# Patient Record
Sex: Male | Born: 1950
Health system: Southern US, Community
[De-identification: ages and names within clinical notes are randomized; demographics above are authoritative.]

## PROBLEM LIST (undated history)

## (undated) DIAGNOSIS — J449 Chronic obstructive pulmonary disease, unspecified: Secondary | ICD-10-CM

## (undated) DIAGNOSIS — E119 Type 2 diabetes mellitus without complications: Secondary | ICD-10-CM

## (undated) DIAGNOSIS — M1990 Unspecified osteoarthritis, unspecified site: Secondary | ICD-10-CM

## (undated) DIAGNOSIS — E785 Hyperlipidemia, unspecified: Secondary | ICD-10-CM

## (undated) DIAGNOSIS — I10 Essential (primary) hypertension: Secondary | ICD-10-CM

## (undated) HISTORY — DX: Unspecified osteoarthritis, unspecified site: M19.90

## (undated) HISTORY — PX: LEG SURGERY: SHX1003

## (undated) HISTORY — PX: SHOULDER SURGERY: SHX246

## (undated) HISTORY — DX: Essential (primary) hypertension: I10

## (undated) HISTORY — DX: Hyperlipidemia, unspecified: E78.5

## (undated) HISTORY — DX: Chronic obstructive pulmonary disease, unspecified: J44.9

## (undated) HISTORY — PX: ANKLE SURGERY: SHX546

---

## 1999-11-20 ENCOUNTER — Ambulatory Visit (HOSPITAL_COMMUNITY): Admission: RE | Admit: 1999-11-20 | Discharge: 1999-11-20 | Payer: Self-pay | Admitting: Orthopedic Surgery

## 1999-11-20 ENCOUNTER — Encounter: Payer: Self-pay | Admitting: Neurology

## 2009-03-29 ENCOUNTER — Ambulatory Visit: Payer: Self-pay | Admitting: Unknown Physician Specialty

## 2009-05-13 ENCOUNTER — Ambulatory Visit: Payer: Self-pay | Admitting: Unknown Physician Specialty

## 2010-06-22 ENCOUNTER — Ambulatory Visit: Payer: Self-pay | Admitting: Surgery

## 2011-09-19 ENCOUNTER — Ambulatory Visit: Payer: Self-pay | Admitting: Internal Medicine

## 2014-03-14 DIAGNOSIS — E7800 Pure hypercholesterolemia, unspecified: Secondary | ICD-10-CM | POA: Insufficient documentation

## 2014-03-14 DIAGNOSIS — F1020 Alcohol dependence, uncomplicated: Secondary | ICD-10-CM | POA: Insufficient documentation

## 2014-03-14 DIAGNOSIS — M1990 Unspecified osteoarthritis, unspecified site: Secondary | ICD-10-CM | POA: Insufficient documentation

## 2014-03-14 DIAGNOSIS — J449 Chronic obstructive pulmonary disease, unspecified: Secondary | ICD-10-CM | POA: Insufficient documentation

## 2014-03-14 DIAGNOSIS — F3341 Major depressive disorder, recurrent, in partial remission: Secondary | ICD-10-CM | POA: Insufficient documentation

## 2014-03-14 DIAGNOSIS — J4489 Other specified chronic obstructive pulmonary disease: Secondary | ICD-10-CM | POA: Insufficient documentation

## 2014-03-14 DIAGNOSIS — K7010 Alcoholic hepatitis without ascites: Secondary | ICD-10-CM | POA: Insufficient documentation

## 2014-03-31 DIAGNOSIS — I739 Peripheral vascular disease, unspecified: Secondary | ICD-10-CM | POA: Insufficient documentation

## 2014-07-22 ENCOUNTER — Ambulatory Visit: Payer: Self-pay | Admitting: Vascular Surgery

## 2014-07-22 DIAGNOSIS — E119 Type 2 diabetes mellitus without complications: Secondary | ICD-10-CM | POA: Diagnosis not present

## 2014-07-22 DIAGNOSIS — I70211 Atherosclerosis of native arteries of extremities with intermittent claudication, right leg: Secondary | ICD-10-CM | POA: Diagnosis not present

## 2014-07-22 DIAGNOSIS — F17200 Nicotine dependence, unspecified, uncomplicated: Secondary | ICD-10-CM | POA: Diagnosis not present

## 2014-07-22 DIAGNOSIS — I739 Peripheral vascular disease, unspecified: Secondary | ICD-10-CM | POA: Diagnosis not present

## 2014-07-22 DIAGNOSIS — J449 Chronic obstructive pulmonary disease, unspecified: Secondary | ICD-10-CM | POA: Diagnosis not present

## 2014-07-22 DIAGNOSIS — F1020 Alcohol dependence, uncomplicated: Secondary | ICD-10-CM | POA: Diagnosis not present

## 2014-07-22 DIAGNOSIS — I10 Essential (primary) hypertension: Secondary | ICD-10-CM | POA: Diagnosis not present

## 2014-07-22 DIAGNOSIS — E780 Pure hypercholesterolemia: Secondary | ICD-10-CM | POA: Diagnosis not present

## 2014-07-22 LAB — BASIC METABOLIC PANEL
Anion Gap: 4 — ABNORMAL LOW (ref 7–16)
BUN: 8 mg/dL (ref 7–18)
Calcium, Total: 8.6 mg/dL (ref 8.5–10.1)
Chloride: 102 mmol/L (ref 98–107)
Co2: 32 mmol/L (ref 21–32)
Creatinine: 0.9 mg/dL (ref 0.60–1.30)
EGFR (African American): >60
EGFR (Non-African Amer.): >60
Glucose: 153 mg/dL — ABNORMAL HIGH (ref 65–99)
Osmolality: 277 (ref 275–301)
Potassium: 4.7 mmol/L (ref 3.5–5.1)
Sodium: 138 mmol/L (ref 136–145)

## 2014-07-29 DIAGNOSIS — E119 Type 2 diabetes mellitus without complications: Secondary | ICD-10-CM | POA: Diagnosis not present

## 2014-07-29 DIAGNOSIS — E780 Pure hypercholesterolemia: Secondary | ICD-10-CM | POA: Diagnosis not present

## 2014-08-03 DIAGNOSIS — I739 Peripheral vascular disease, unspecified: Secondary | ICD-10-CM | POA: Diagnosis not present

## 2014-08-03 DIAGNOSIS — I10 Essential (primary) hypertension: Secondary | ICD-10-CM | POA: Diagnosis not present

## 2014-08-03 DIAGNOSIS — E780 Pure hypercholesterolemia: Secondary | ICD-10-CM | POA: Diagnosis not present

## 2014-08-03 DIAGNOSIS — E119 Type 2 diabetes mellitus without complications: Secondary | ICD-10-CM | POA: Diagnosis not present

## 2014-09-03 DIAGNOSIS — I10 Essential (primary) hypertension: Secondary | ICD-10-CM | POA: Diagnosis not present

## 2014-09-03 DIAGNOSIS — E669 Obesity, unspecified: Secondary | ICD-10-CM | POA: Diagnosis not present

## 2014-09-03 DIAGNOSIS — I70213 Atherosclerosis of native arteries of extremities with intermittent claudication, bilateral legs: Secondary | ICD-10-CM | POA: Diagnosis not present

## 2014-09-03 DIAGNOSIS — F1020 Alcohol dependence, uncomplicated: Secondary | ICD-10-CM | POA: Diagnosis not present

## 2014-09-03 DIAGNOSIS — E785 Hyperlipidemia, unspecified: Secondary | ICD-10-CM | POA: Diagnosis not present

## 2014-09-03 DIAGNOSIS — I739 Peripheral vascular disease, unspecified: Secondary | ICD-10-CM | POA: Diagnosis not present

## 2014-09-03 DIAGNOSIS — F17200 Nicotine dependence, unspecified, uncomplicated: Secondary | ICD-10-CM | POA: Diagnosis not present

## 2014-11-07 NOTE — Op Note (Signed)
PATIENT NAME:  Jack Henson, Jack Henson MR#:  [id number] DATE OF BIRTH:  [birth year]  DATE OF PROCEDURE:  07/22/2014  PREOPERATIVE DIAGNOSES: 1. Peripheral arterial disease with claudication, bilateral lower extremities, right worse than left.  2. Alcohol and tobacco dependence.  3. Hypertension.   POSTOPERATIVE DIAGNOSES:  1. Peripheral arterial disease with claudication, bilateral lower extremities, right worse than left.  2. Alcohol and tobacco dependence.  3. Hypertension.   OPERATIVE PROCEDURES: 1. Ultrasound guidance for vascular access, left femoral artery.  2. Catheter placement into right posterior tibial and right peroneal arteries from left femoral approach.  3. Aortogram and selective right lower extremity angiogram.  4. Percutaneous transluminal angioplasty of right peroneal artery with 3 mm diameter angioplasty balloon.  5. Percutaneous transluminal angioplasty of right posterior tibial artery with 3 mm diameter angioplasty balloon.  6. Percutaneous transluminal angioplasty of right distal superficial femoral and above-knee popliteal artery with 6 mm diameter drug-coated Lutonix angioplasty balloon.  7. Self-expanding stent placement to the distal right SFA and above-knee popliteal artery with 7 mm diameter self-expanding stent for greater than 50% residual stenosis and dissection after angioplasty.  8. StarClose closure device, left femoral artery.   SURGEON: Algernon Huxley, MD.   ANESTHESIA: Local with moderate conscious sedation.   ESTIMATED BLOOD LOSS: 25 mL.   INDICATION FOR PROCEDURE: This is a gentleman who I saw in the office about a month ago. He had severe right lower extremity pain consistent with short distance claudication. He also had some claudication symptoms on the left. His ABI was mildly reduced on the left and markedly reduced on the right, and he is brought in for angiography for further evaluation and potential treatment. Risks and benefits were discussed. Informed  consent was obtained.   DESCRIPTION OF PROCEDURE: The patient is brought to the vascular suite. Groins were shaved and prepped and a sterile surgical field was created. The left femoral head was localized with fluoroscopy. The left femoral artery was visualized with ultrasound and found to be widely patent. It was then accessed under direct ultrasound guidance without difficulty with a Seldinger needle and a permanent image recorded. A 5 French sheath was placed. Pigtail catheter was placed in the aorta at the L1 level and AP aortogram was performed. This showed patent aorta and iliac segments without flow-limiting stenosis. The renal arteries are patent bilaterally without significant stenosis. I then crossed the aortic bifurcation with the catheter and the Advantage wire. I advanced the right femoral head and selective right lower extremity angiogram was then performed. This demonstrated an occlusion in the distal superficial femoral artery to reconstitute it in the above-knee popliteal artery. He then had tibial disease distally. His anterior tibial artery was diseased proximally and then chronically occluded in the mid to distal segments. The peroneal artery had diffuse areas of stenosis in its proximal and mid segments, but was continuous, and the posterior tibial artery had about an 80% stenosis about 3 cm beyond its origin and then was the best runoff distally. The patient was heparinized. A 6 French Ansell sheath was placed over a [employer] wire. I was able to cross the SFA and popliteal occlusion and confirm intraluminal flow in the popliteal artery without difficulty. I then exchanged for an 0.018 wire to address the tibial disease. With a Kumpe catheter, I advanced into the peroneal artery first with the 0.018 wire. A 3 mm diameter angioplasty balloon was inflated from the distal peroneal artery, up through the tibioperoneal trunk. Multiple areas of  narrowing were seen, which resolved with  angioplasty. The balloon was then deflated. The Kumpe catheter was placed in the tibioperoneal trunk and much better flow was seen through the peroneal artery. I then turned my attention to selectively cannulating the posterior tibial artery separately. The Kumpe catheter and the 0.018 wire were used to navigate in the posterior tibial artery and crossed the high-grade stenosis. A 3 mm diameter angioplasty balloon was then inflated from in the mid and proximal posterior tibial artery, up through the tibioperoneal trunk. The balloon was then removed. I then exchanged with a Kumpe catheter parked in the tibioperoneal trunk and imaging was performed, which showed excellent angiographic result in the posterior tibial artery and better flow distally, as well, in the peroneal artery. I then replaced the Advantage wire. The SFA and popliteal lesion were treated with a 6 mm diameter Lutonix drug-coated angioplasty balloon; however, after angioplasty, there was dissection, both at the proximal and initial occlusion and then the re-entry site in the above-knee popliteal artery. The proximal lesion also had a very high-grade stenosis of about 70% to 80%, so I elected to cover these areas with a self-expanding stent. A 7 mm diameter x 15 cm length self-expanding stent was selected, post dilated with a 6 mm balloon with an excellent angiographic completion result and no significant residual stenosis. I then pulled the sheath back to the ipsilateral external iliac artery and oblique arteriogram was performed. StarClose closure device was deployed in the usual fashion with excellent hemostatic result. The patient tolerated the procedure well and was taken to the recovery room in stable condition.    ____________________________ Algernon Huxley, MD jsd:mw D: 07/22/2014 10:02:36 ET T: 07/22/2014 14:33:16 ET JOB#: [id number]  cc: Algernon Huxley, MD, <Dictator> Algernon Huxley MD ELECTRONICALLY SIGNED 07/29/2014 10:36

## 2014-11-25 DIAGNOSIS — E119 Type 2 diabetes mellitus without complications: Secondary | ICD-10-CM | POA: Diagnosis not present

## 2014-11-25 DIAGNOSIS — I739 Peripheral vascular disease, unspecified: Secondary | ICD-10-CM | POA: Diagnosis not present

## 2014-12-02 DIAGNOSIS — F1010 Alcohol abuse, uncomplicated: Secondary | ICD-10-CM | POA: Diagnosis not present

## 2014-12-02 DIAGNOSIS — F3341 Major depressive disorder, recurrent, in partial remission: Secondary | ICD-10-CM | POA: Diagnosis not present

## 2014-12-02 DIAGNOSIS — J449 Chronic obstructive pulmonary disease, unspecified: Secondary | ICD-10-CM | POA: Diagnosis not present

## 2014-12-02 DIAGNOSIS — E780 Pure hypercholesterolemia: Secondary | ICD-10-CM | POA: Diagnosis not present

## 2014-12-03 DIAGNOSIS — E669 Obesity, unspecified: Secondary | ICD-10-CM | POA: Diagnosis not present

## 2014-12-03 DIAGNOSIS — I739 Peripheral vascular disease, unspecified: Secondary | ICD-10-CM | POA: Diagnosis not present

## 2014-12-03 DIAGNOSIS — F1020 Alcohol dependence, uncomplicated: Secondary | ICD-10-CM | POA: Diagnosis not present

## 2014-12-03 DIAGNOSIS — F17200 Nicotine dependence, unspecified, uncomplicated: Secondary | ICD-10-CM | POA: Diagnosis not present

## 2014-12-03 DIAGNOSIS — I10 Essential (primary) hypertension: Secondary | ICD-10-CM | POA: Diagnosis not present

## 2014-12-03 DIAGNOSIS — I70213 Atherosclerosis of native arteries of extremities with intermittent claudication, bilateral legs: Secondary | ICD-10-CM | POA: Diagnosis not present

## 2014-12-03 DIAGNOSIS — E785 Hyperlipidemia, unspecified: Secondary | ICD-10-CM | POA: Diagnosis not present

## 2014-12-28 DIAGNOSIS — E669 Obesity, unspecified: Secondary | ICD-10-CM | POA: Diagnosis not present

## 2014-12-28 DIAGNOSIS — I10 Essential (primary) hypertension: Secondary | ICD-10-CM | POA: Diagnosis not present

## 2014-12-28 DIAGNOSIS — E785 Hyperlipidemia, unspecified: Secondary | ICD-10-CM | POA: Diagnosis not present

## 2014-12-28 DIAGNOSIS — F17200 Nicotine dependence, unspecified, uncomplicated: Secondary | ICD-10-CM | POA: Diagnosis not present

## 2014-12-28 DIAGNOSIS — M7989 Other specified soft tissue disorders: Secondary | ICD-10-CM | POA: Diagnosis not present

## 2014-12-28 DIAGNOSIS — I70213 Atherosclerosis of native arteries of extremities with intermittent claudication, bilateral legs: Secondary | ICD-10-CM | POA: Diagnosis not present

## 2014-12-28 DIAGNOSIS — F1020 Alcohol dependence, uncomplicated: Secondary | ICD-10-CM | POA: Diagnosis not present

## 2014-12-28 DIAGNOSIS — I739 Peripheral vascular disease, unspecified: Secondary | ICD-10-CM | POA: Diagnosis not present

## 2015-04-04 DIAGNOSIS — H524 Presbyopia: Secondary | ICD-10-CM | POA: Diagnosis not present

## 2015-04-04 DIAGNOSIS — H5210 Myopia, unspecified eye: Secondary | ICD-10-CM | POA: Diagnosis not present

## 2015-06-01 DIAGNOSIS — F1010 Alcohol abuse, uncomplicated: Secondary | ICD-10-CM | POA: Diagnosis not present

## 2015-06-01 DIAGNOSIS — E7800 Pure hypercholesterolemia, unspecified: Secondary | ICD-10-CM | POA: Diagnosis not present

## 2015-06-01 DIAGNOSIS — I739 Peripheral vascular disease, unspecified: Secondary | ICD-10-CM | POA: Diagnosis not present

## 2015-06-01 DIAGNOSIS — E119 Type 2 diabetes mellitus without complications: Secondary | ICD-10-CM | POA: Diagnosis not present

## 2015-07-01 DIAGNOSIS — F1020 Alcohol dependence, uncomplicated: Secondary | ICD-10-CM | POA: Diagnosis not present

## 2015-07-01 DIAGNOSIS — F17200 Nicotine dependence, unspecified, uncomplicated: Secondary | ICD-10-CM | POA: Diagnosis not present

## 2015-07-01 DIAGNOSIS — E785 Hyperlipidemia, unspecified: Secondary | ICD-10-CM | POA: Diagnosis not present

## 2015-07-01 DIAGNOSIS — M7989 Other specified soft tissue disorders: Secondary | ICD-10-CM | POA: Diagnosis not present

## 2015-07-01 DIAGNOSIS — E669 Obesity, unspecified: Secondary | ICD-10-CM | POA: Diagnosis not present

## 2015-07-01 DIAGNOSIS — I70213 Atherosclerosis of native arteries of extremities with intermittent claudication, bilateral legs: Secondary | ICD-10-CM | POA: Diagnosis not present

## 2015-07-01 DIAGNOSIS — I10 Essential (primary) hypertension: Secondary | ICD-10-CM | POA: Diagnosis not present

## 2015-07-01 DIAGNOSIS — I739 Peripheral vascular disease, unspecified: Secondary | ICD-10-CM | POA: Diagnosis not present

## 2015-08-16 DIAGNOSIS — E7800 Pure hypercholesterolemia, unspecified: Secondary | ICD-10-CM | POA: Diagnosis not present

## 2015-08-16 DIAGNOSIS — I10 Essential (primary) hypertension: Secondary | ICD-10-CM | POA: Diagnosis not present

## 2015-08-16 DIAGNOSIS — F3341 Major depressive disorder, recurrent, in partial remission: Secondary | ICD-10-CM | POA: Diagnosis not present

## 2015-08-16 DIAGNOSIS — F1020 Alcohol dependence, uncomplicated: Secondary | ICD-10-CM | POA: Diagnosis not present

## 2015-08-16 DIAGNOSIS — Z0000 Encounter for general adult medical examination without abnormal findings: Secondary | ICD-10-CM | POA: Diagnosis not present

## 2015-08-16 DIAGNOSIS — K7010 Alcoholic hepatitis without ascites: Secondary | ICD-10-CM | POA: Diagnosis not present

## 2015-08-16 DIAGNOSIS — M25562 Pain in left knee: Secondary | ICD-10-CM | POA: Diagnosis not present

## 2015-08-16 DIAGNOSIS — J449 Chronic obstructive pulmonary disease, unspecified: Secondary | ICD-10-CM | POA: Diagnosis not present

## 2015-08-16 DIAGNOSIS — E119 Type 2 diabetes mellitus without complications: Secondary | ICD-10-CM | POA: Diagnosis not present

## 2015-08-16 DIAGNOSIS — M179 Osteoarthritis of knee, unspecified: Secondary | ICD-10-CM | POA: Diagnosis not present

## 2015-09-08 DIAGNOSIS — G8929 Other chronic pain: Secondary | ICD-10-CM | POA: Diagnosis not present

## 2015-09-08 DIAGNOSIS — M25562 Pain in left knee: Secondary | ICD-10-CM | POA: Diagnosis not present

## 2015-09-08 DIAGNOSIS — M1712 Unilateral primary osteoarthritis, left knee: Secondary | ICD-10-CM | POA: Diagnosis not present

## 2015-10-06 DIAGNOSIS — M1712 Unilateral primary osteoarthritis, left knee: Secondary | ICD-10-CM | POA: Diagnosis not present

## 2015-10-13 DIAGNOSIS — M1712 Unilateral primary osteoarthritis, left knee: Secondary | ICD-10-CM | POA: Diagnosis not present

## 2015-10-20 DIAGNOSIS — M1712 Unilateral primary osteoarthritis, left knee: Secondary | ICD-10-CM | POA: Diagnosis not present

## 2015-12-07 DIAGNOSIS — E7800 Pure hypercholesterolemia, unspecified: Secondary | ICD-10-CM | POA: Diagnosis not present

## 2015-12-07 DIAGNOSIS — E119 Type 2 diabetes mellitus without complications: Secondary | ICD-10-CM | POA: Diagnosis not present

## 2015-12-14 DIAGNOSIS — F3341 Major depressive disorder, recurrent, in partial remission: Secondary | ICD-10-CM | POA: Diagnosis not present

## 2015-12-14 DIAGNOSIS — Z0000 Encounter for general adult medical examination without abnormal findings: Secondary | ICD-10-CM | POA: Insufficient documentation

## 2015-12-14 DIAGNOSIS — Z23 Encounter for immunization: Secondary | ICD-10-CM | POA: Diagnosis not present

## 2015-12-14 DIAGNOSIS — E7800 Pure hypercholesterolemia, unspecified: Secondary | ICD-10-CM | POA: Diagnosis not present

## 2015-12-14 DIAGNOSIS — F1020 Alcohol dependence, uncomplicated: Secondary | ICD-10-CM | POA: Diagnosis not present

## 2015-12-14 DIAGNOSIS — J449 Chronic obstructive pulmonary disease, unspecified: Secondary | ICD-10-CM | POA: Diagnosis not present

## 2015-12-14 DIAGNOSIS — I739 Peripheral vascular disease, unspecified: Secondary | ICD-10-CM | POA: Diagnosis not present

## 2015-12-14 DIAGNOSIS — E119 Type 2 diabetes mellitus without complications: Secondary | ICD-10-CM | POA: Diagnosis not present

## 2016-01-03 DIAGNOSIS — I10 Essential (primary) hypertension: Secondary | ICD-10-CM | POA: Diagnosis not present

## 2016-01-03 DIAGNOSIS — E785 Hyperlipidemia, unspecified: Secondary | ICD-10-CM | POA: Diagnosis not present

## 2016-01-03 DIAGNOSIS — I70212 Atherosclerosis of native arteries of extremities with intermittent claudication, left leg: Secondary | ICD-10-CM | POA: Diagnosis not present

## 2016-01-03 DIAGNOSIS — F1020 Alcohol dependence, uncomplicated: Secondary | ICD-10-CM | POA: Diagnosis not present

## 2016-01-03 DIAGNOSIS — I739 Peripheral vascular disease, unspecified: Secondary | ICD-10-CM | POA: Diagnosis not present

## 2016-01-03 DIAGNOSIS — F17200 Nicotine dependence, unspecified, uncomplicated: Secondary | ICD-10-CM | POA: Diagnosis not present

## 2016-01-03 DIAGNOSIS — I70213 Atherosclerosis of native arteries of extremities with intermittent claudication, bilateral legs: Secondary | ICD-10-CM | POA: Diagnosis not present

## 2016-01-03 DIAGNOSIS — E669 Obesity, unspecified: Secondary | ICD-10-CM | POA: Diagnosis not present

## 2016-01-03 DIAGNOSIS — M7989 Other specified soft tissue disorders: Secondary | ICD-10-CM | POA: Diagnosis not present

## 2016-06-13 DIAGNOSIS — E119 Type 2 diabetes mellitus without complications: Secondary | ICD-10-CM | POA: Diagnosis not present

## 2016-06-13 DIAGNOSIS — E7800 Pure hypercholesterolemia, unspecified: Secondary | ICD-10-CM | POA: Diagnosis not present

## 2016-06-20 DIAGNOSIS — F1020 Alcohol dependence, uncomplicated: Secondary | ICD-10-CM | POA: Diagnosis not present

## 2016-06-20 DIAGNOSIS — E119 Type 2 diabetes mellitus without complications: Secondary | ICD-10-CM | POA: Diagnosis not present

## 2016-06-20 DIAGNOSIS — I10 Essential (primary) hypertension: Secondary | ICD-10-CM | POA: Diagnosis not present

## 2016-06-20 DIAGNOSIS — Z0000 Encounter for general adult medical examination without abnormal findings: Secondary | ICD-10-CM | POA: Diagnosis not present

## 2016-06-20 DIAGNOSIS — K7010 Alcoholic hepatitis without ascites: Secondary | ICD-10-CM | POA: Diagnosis not present

## 2016-06-20 DIAGNOSIS — J449 Chronic obstructive pulmonary disease, unspecified: Secondary | ICD-10-CM | POA: Diagnosis not present

## 2016-06-20 DIAGNOSIS — F3341 Major depressive disorder, recurrent, in partial remission: Secondary | ICD-10-CM | POA: Diagnosis not present

## 2016-06-20 DIAGNOSIS — I739 Peripheral vascular disease, unspecified: Secondary | ICD-10-CM | POA: Diagnosis not present

## 2016-06-20 DIAGNOSIS — E7800 Pure hypercholesterolemia, unspecified: Secondary | ICD-10-CM | POA: Diagnosis not present

## 2016-07-06 ENCOUNTER — Other Ambulatory Visit (INDEPENDENT_AMBULATORY_CARE_PROVIDER_SITE_OTHER): Payer: Self-pay | Admitting: Vascular Surgery

## 2016-07-06 DIAGNOSIS — I739 Peripheral vascular disease, unspecified: Secondary | ICD-10-CM

## 2016-07-10 ENCOUNTER — Ambulatory Visit (INDEPENDENT_AMBULATORY_CARE_PROVIDER_SITE_OTHER): Payer: Commercial Managed Care - HMO | Admitting: Vascular Surgery

## 2016-07-10 ENCOUNTER — Ambulatory Visit (INDEPENDENT_AMBULATORY_CARE_PROVIDER_SITE_OTHER): Payer: Commercial Managed Care - HMO

## 2016-07-10 ENCOUNTER — Encounter (INDEPENDENT_AMBULATORY_CARE_PROVIDER_SITE_OTHER): Payer: Self-pay | Admitting: Vascular Surgery

## 2016-07-10 VITALS — BP 141/88 | HR 82 | Resp 17 | Ht 66.0 in | Wt 181.0 lb

## 2016-07-10 DIAGNOSIS — I10 Essential (primary) hypertension: Secondary | ICD-10-CM | POA: Diagnosis not present

## 2016-07-10 DIAGNOSIS — I739 Peripheral vascular disease, unspecified: Secondary | ICD-10-CM

## 2016-07-10 DIAGNOSIS — I70219 Atherosclerosis of native arteries of extremities with intermittent claudication, unspecified extremity: Secondary | ICD-10-CM | POA: Diagnosis not present

## 2016-07-10 DIAGNOSIS — E119 Type 2 diabetes mellitus without complications: Secondary | ICD-10-CM | POA: Insufficient documentation

## 2016-07-10 NOTE — Progress Notes (Signed)
MRN : [id number]  Jack Henson is a 66 y.o. ([birth year]) male who presents with chief complaint of  Chief Complaint  Patient presents with  . Follow-up  .  History of Present Illness: Patient returns today in follow up of PAD. He still has some pain in his legs with walking but is not currently lifestyle limiting. He overall seems to be doing pretty well. He does not have ulceration or infection. ABIs today are stable at 0.97 on the right and 1.06 on the left with pretty good wave forms. He does have some areas of elevated velocities in the right popliteal artery just below the previously placed stent as well as in the tibioperoneal trunk and in the left SFA/popliteal artery. None of these appear particularly high-grade  Current Outpatient Prescriptions  Medication Sig Dispense Refill  . amLODipine (NORVASC) 10 MG tablet Take by mouth.    . losartan (COZAAR) 100 MG tablet TAKE 1 TABLET ONE TIME DAILY    . omeprazole (PRILOSEC) 20 MG capsule Take by mouth.    . sertraline (ZOLOFT) 50 MG tablet Take by mouth.     No current facility-administered medications for this visit.     Past Medical History:  Diagnosis Date  . Arthritis   . COPD (chronic obstructive pulmonary disease) ([place])   . Hyperlipidemia   . Hypertension     Past Surgical History:  Procedure Laterality Date  . ANKLE SURGERY Right   . LEG SURGERY     stent placement  . SHOULDER SURGERY Right     Social History Social History  Substance Use Topics  . Smoking status: Former Research scientist (life sciences)  . Smokeless tobacco: Never Used  . Alcohol use Yes     Family History Family History  Problem Relation Age of Onset  . Kidney disease Mother   . Diabetes Father      No Known Allergies   REVIEW OF SYSTEMS (Negative unless checked)  Constitutional: [] Weight loss  [] Fever  [] Chills Cardiac: [] Chest pain   [] Chest pressure   [] Palpitations   [] Shortness of breath when laying flat   [] Shortness of breath at rest    [] Shortness of breath with exertion. Vascular:  [x] Pain in legs with walking   [] Pain in legs at rest   [] Pain in legs when laying flat   [x] Claudication   [] Pain in feet when walking  [] Pain in feet at rest  [] Pain in feet when laying flat   [] History of DVT   [] Phlebitis   [] Swelling in legs   [] Varicose veins   [] Non-healing ulcers Pulmonary:   [] Uses home oxygen   [] Productive cough   [] Hemoptysis   [] Wheeze  [] COPD   [] Asthma Neurologic:  [] Dizziness  [] Blackouts   [] Seizures   [] History of stroke   [] History of TIA  [] Aphasia   [] Temporary blindness   [] Dysphagia   [] Weakness or numbness in arms   [] Weakness or numbness in legs Musculoskeletal:  [x] Arthritis   [] Joint swelling   [x] Joint pain   [] Low back pain Hematologic:  [] Easy bruising  [] Easy bleeding   [] Hypercoagulable state   [] Anemic   Gastrointestinal:  [] Blood in stool   [] Vomiting blood  [] Gastroesophageal reflux/heartburn   [] Abdominal pain Genitourinary:  [] Chronic kidney disease   [] Difficult urination  [] Frequent urination  [] Burning with urination   [] Hematuria Skin:  [] Rashes   [] Ulcers   [] Wounds Psychological:  [] History of anxiety   []  History of major depression.  Physical Examination  BP (!) 141/88  Pulse 82   Resp 17   Ht 5\' 6"  (1.676 m)   Wt 181 lb (82.1 kg)   BMI 29.21 kg/m  Gen:  WD/WN, NAD. Appears older than stated age Head: [place]/AT, No temporalis wasting. Ear/Nose/Throat: Hearing grossly intact, nares w/o erythema or drainage, trachea midline Eyes: Conjunctiva clear. Sclera non-icteric Neck: Supple.  No JVD.  Pulmonary:  Good air movement, no use of accessory muscles.  Cardiac: RRR, normal S1, S2 Vascular:  Vessel Right Left  Radial Palpable Palpable  Ulnar Palpable Palpable  Brachial Palpable Palpable  Carotid Palpable, without bruit Palpable, without bruit  Aorta Not palpable N/A  Femoral Palpable Palpable  Popliteal Palpable Palpable  PT Trace Palpable Palpable  DP Palpable Palpable    Gastrointestinal: soft, non-tender/non-distended. No guarding/reflex.  Musculoskeletal: M/S 5/5 throughout.  Walks with a cane Neurologic: Sensation grossly intact in extremities.  Symmetrical.  Speech is fluent.  Psychiatric: Judgment intact, Mood & affect appropriate for pt's clinical situation. Dermatologic: No rashes or ulcers noted.  No cellulitis or open wounds. Lymph : No Cervical, Axillary, or Inguinal lymphadenopathy.      Labs No results found for this or any previous visit (from the past 2160 hour(s)).  Radiology No results found.    Assessment/Plan  Diabetes ([place]) Now off of metformin as sugars have been running good. Glucose control important to reduce progression of atherosclerosis.  Essential hypertension, benign blood pressure control important in reducing the progression of atherosclerotic disease. On appropriate oral medications.   Atherosclerosis with limb claudication ([place]) Symptoms are currently stable and not lifestyle limiting. ABIs today are stable at 0.97 on the right and 1.06 on the left with pretty good wave forms. He does have some areas of elevated velocities in the right popliteal artery just below the previously placed stent as well as in the tibioperoneal trunk and in the left SFA/popliteal artery. None of these appear particularly high-grade, but all need to be monitored. He will continue his aspirin therapy. He will return in 6 months with noninvasive studies.    Leotis Pain, MD  07/10/2016 12:44 PM    This note was created with Dragon medical transcription system.  Any errors from dictation are purely unintentional

## 2016-07-10 NOTE — Assessment & Plan Note (Signed)
Now off of metformin as sugars have been running good. Glucose control important to reduce progression of atherosclerosis.

## 2016-07-10 NOTE — Assessment & Plan Note (Signed)
blood pressure control important in reducing the progression of atherosclerotic disease. On appropriate oral medications.  

## 2016-07-10 NOTE — Assessment & Plan Note (Signed)
Symptoms are currently stable and not lifestyle limiting. ABIs today are stable at 0.97 on the right and 1.06 on the left with pretty good wave forms. He does have some areas of elevated velocities in the right popliteal artery just below the previously placed stent as well as in the tibioperoneal trunk and in the left SFA/popliteal artery. None of these appear particularly high-grade, but all need to be monitored. He will continue his aspirin therapy. He will return in 6 months with noninvasive studies.

## 2016-12-26 ENCOUNTER — Ambulatory Visit (INDEPENDENT_AMBULATORY_CARE_PROVIDER_SITE_OTHER): Payer: Self-pay | Admitting: Vascular Surgery

## 2016-12-26 ENCOUNTER — Encounter (INDEPENDENT_AMBULATORY_CARE_PROVIDER_SITE_OTHER): Payer: Self-pay

## 2017-01-01 DIAGNOSIS — E119 Type 2 diabetes mellitus without complications: Secondary | ICD-10-CM | POA: Diagnosis not present

## 2017-01-01 DIAGNOSIS — F3341 Major depressive disorder, recurrent, in partial remission: Secondary | ICD-10-CM | POA: Diagnosis not present

## 2017-01-01 DIAGNOSIS — I10 Essential (primary) hypertension: Secondary | ICD-10-CM | POA: Diagnosis not present

## 2017-01-01 DIAGNOSIS — E7800 Pure hypercholesterolemia, unspecified: Secondary | ICD-10-CM | POA: Diagnosis not present

## 2017-01-08 DIAGNOSIS — J449 Chronic obstructive pulmonary disease, unspecified: Secondary | ICD-10-CM | POA: Diagnosis not present

## 2017-01-08 DIAGNOSIS — E7800 Pure hypercholesterolemia, unspecified: Secondary | ICD-10-CM | POA: Diagnosis not present

## 2017-01-08 DIAGNOSIS — F3341 Major depressive disorder, recurrent, in partial remission: Secondary | ICD-10-CM | POA: Diagnosis not present

## 2017-01-08 DIAGNOSIS — F1020 Alcohol dependence, uncomplicated: Secondary | ICD-10-CM | POA: Diagnosis not present

## 2017-01-08 DIAGNOSIS — Z0000 Encounter for general adult medical examination without abnormal findings: Secondary | ICD-10-CM | POA: Diagnosis not present

## 2017-01-08 DIAGNOSIS — I10 Essential (primary) hypertension: Secondary | ICD-10-CM | POA: Diagnosis not present

## 2017-01-08 DIAGNOSIS — I739 Peripheral vascular disease, unspecified: Secondary | ICD-10-CM | POA: Diagnosis not present

## 2017-01-08 DIAGNOSIS — E119 Type 2 diabetes mellitus without complications: Secondary | ICD-10-CM | POA: Diagnosis not present

## 2017-02-05 ENCOUNTER — Ambulatory Visit (INDEPENDENT_AMBULATORY_CARE_PROVIDER_SITE_OTHER): Payer: Medicare HMO

## 2017-02-05 ENCOUNTER — Ambulatory Visit (INDEPENDENT_AMBULATORY_CARE_PROVIDER_SITE_OTHER): Payer: Medicare HMO | Admitting: Vascular Surgery

## 2017-02-05 VITALS — HR 72 | Resp 17 | Ht 66.0 in | Wt 180.0 lb

## 2017-02-05 DIAGNOSIS — I70219 Atherosclerosis of native arteries of extremities with intermittent claudication, unspecified extremity: Secondary | ICD-10-CM

## 2017-02-05 DIAGNOSIS — E119 Type 2 diabetes mellitus without complications: Secondary | ICD-10-CM | POA: Diagnosis not present

## 2017-02-05 DIAGNOSIS — I10 Essential (primary) hypertension: Secondary | ICD-10-CM | POA: Diagnosis not present

## 2017-02-05 NOTE — Assessment & Plan Note (Signed)
His noninvasive studies today show moderately elevated velocities in the right SFA stent flow was preserved with a right ABI of 1.02 and a left ABI of 0.98. At this point, we can follow him on an annual basis with noninvasive studies. He will contact our office for any problems in the interim.

## 2017-02-05 NOTE — Progress Notes (Signed)
MRN : [id number]  Jack Henson is a 66 y.o. (February 17, 1951) male who presents with chief complaint of  Chief Complaint  Patient presents with  . Routine Post Op    6 month follow up u/s  .  History of Present Illness: Patient returns today in follow up of PAD. He is about 2-1/2 years status post intervention for severe right lower extremity ischemia. Quit smoking after that he currently is doing well without any major symptoms in his legs. He denies ulceration, infection, or ischemic rest pain. He really does not have much claudication symptoms currently. His noninvasive studies today show moderately elevated velocities in the right SFA stent flow was preserved with a right ABI of 1.02 and a left ABI of 0.98.  Current Outpatient Prescriptions  Medication Sig Dispense Refill  . amLODipine (NORVASC) 10 MG tablet Take by mouth.    . losartan (COZAAR) 100 MG tablet TAKE 1 TABLET ONE TIME DAILY    . omeprazole (PRILOSEC) 20 MG capsule Take by mouth.    . sertraline (ZOLOFT) 50 MG tablet Take by mouth.     No current facility-administered medications for this visit.         Past Medical History:  Diagnosis Date  . Arthritis   . COPD (chronic obstructive pulmonary disease) ([place])   . Hyperlipidemia   . Hypertension          Past Surgical History:  Procedure Laterality Date  . ANKLE SURGERY Right   . LEG SURGERY     stent placement  . SHOULDER SURGERY Right     Social History     Social History  Substance Use Topics  . Smoking status: Former Research scientist (life sciences)  . Smokeless tobacco: Never Used  . Alcohol use Yes     Family History      Family History  Problem Relation Age of Onset  . Kidney disease Mother   . Diabetes Father      No Known Allergies   REVIEW OF SYSTEMS (Negative unless checked)  Constitutional: [] Weight loss  [] Fever  [] Chills Cardiac: [] Chest pain   [] Chest pressure   [] Palpitations   [] Shortness of breath when laying  flat   [] Shortness of breath at rest   [] Shortness of breath with exertion. Vascular:  [x] Pain in legs with walking   [] Pain in legs at rest   [] Pain in legs when laying flat   [x] Claudication   [] Pain in feet when walking  [] Pain in feet at rest  [] Pain in feet when laying flat   [] History of DVT   [] Phlebitis   [] Swelling in legs   [] Varicose veins   [] Non-healing ulcers Pulmonary:   [] Uses home oxygen   [] Productive cough   [] Hemoptysis   [] Wheeze  [] COPD   [] Asthma Neurologic:  [] Dizziness  [] Blackouts   [] Seizures   [] History of stroke   [] History of TIA  [] Aphasia   [] Temporary blindness   [] Dysphagia   [] Weakness or numbness in arms   [] Weakness or numbness in legs Musculoskeletal:  [x] Arthritis   [] Joint swelling   [x] Joint pain   [] Low back pain Hematologic:  [] Easy bruising  [] Easy bleeding   [] Hypercoagulable state   [] Anemic   Gastrointestinal:  [] Blood in stool   [] Vomiting blood  [] Gastroesophageal reflux/heartburn   [] Abdominal pain Genitourinary:  [] Chronic kidney disease   [] Difficult urination  [] Frequent urination  [] Burning with urination   [] Hematuria Skin:  [] Rashes   [] Ulcers   [] Wounds Psychological:  [] History of anxiety   []   History of major depression.   Physical Examination  Pulse 72   Resp 17   Ht 5\' 6"  (1.676 m)   Wt 180 lb (81.6 kg)   BMI 29.05 kg/m  Gen:  WD/WN, NAD Head: [place]/AT, No temporalis wasting. Ear/Nose/Throat: Hearing grossly intact, nares w/o erythema or drainage, trachea midline Eyes: Conjunctiva clear. Sclera non-icteric Neck: Supple.  No JVD.  Pulmonary:  Good air movement, no use of accessory muscles.  Cardiac: RRR, normal S1, S2 Vascular:  Vessel Right Left  Radial Palpable Palpable                          PT Palpable Palpable  DP Palpable Palpable    Musculoskeletal: M/S 5/5 throughout.  No deformity or atrophy.  Neurologic: Sensation grossly intact in extremities.  Symmetrical.  Speech is fluent.  walks with a cane    Psychiatric: Judgment intact, Mood & affect appropriate for pt's clinical situation. Dermatologic: No rashes or ulcers noted.  No cellulitis or open wounds.       Labs Recent Results (from the past 2160 hour(s))  VAS [place] LOWER EXTREMITY ARTERIAL DUPLEX     Status: None (In process)   Collection Time: 02/05/17 11:30 AM  Result Value Ref Range   Right super femoral prox sys PSV 212 cm/s   Right super femoral mid sys PSV -83 cm/s   Right popliteal dist sys PSV -77 cm/s   Right peroneal sys PSV 33 cm/s   RIGHT ANT DIST TIBAL SYS PSV -19 cm/s   RIGHT POST TIB DIST SYS 49 cm/s    Radiology No results found.    Assessment/Plan Diabetes ([place]) Now off of metformin as sugars have been running good. Glucose control important to reduce progression of atherosclerosis.  Essential hypertension, benign blood pressure control important in reducing the progression of atherosclerotic disease. On appropriate oral medications  Atherosclerosis with limb claudication ([place]) His noninvasive studies today show moderately elevated velocities in the right SFA stent flow was preserved with a right ABI of 1.02 and a left ABI of 0.98. At this point, we can follow him on an annual basis with noninvasive studies. He will contact our office for any problems in the interim.    Leotis Pain, MD  02/05/2017 1:45 PM    This note was created with Dragon medical transcription system.  Any errors from dictation are purely unintentional

## 2017-02-05 NOTE — Patient Instructions (Signed)

## 2017-02-19 DIAGNOSIS — H524 Presbyopia: Secondary | ICD-10-CM | POA: Diagnosis not present

## 2017-02-21 DIAGNOSIS — Z0100 Encounter for examination of eyes and vision without abnormal findings: Secondary | ICD-10-CM | POA: Diagnosis not present

## 2017-07-10 DIAGNOSIS — I10 Essential (primary) hypertension: Secondary | ICD-10-CM | POA: Diagnosis not present

## 2017-07-10 DIAGNOSIS — E119 Type 2 diabetes mellitus without complications: Secondary | ICD-10-CM | POA: Diagnosis not present

## 2017-07-10 DIAGNOSIS — I739 Peripheral vascular disease, unspecified: Secondary | ICD-10-CM | POA: Diagnosis not present

## 2017-07-16 DIAGNOSIS — R0602 Shortness of breath: Secondary | ICD-10-CM | POA: Diagnosis not present

## 2017-07-16 DIAGNOSIS — F3341 Major depressive disorder, recurrent, in partial remission: Secondary | ICD-10-CM | POA: Diagnosis not present

## 2017-07-16 DIAGNOSIS — E119 Type 2 diabetes mellitus without complications: Secondary | ICD-10-CM | POA: Diagnosis not present

## 2017-07-16 DIAGNOSIS — F1020 Alcohol dependence, uncomplicated: Secondary | ICD-10-CM | POA: Diagnosis not present

## 2017-07-16 DIAGNOSIS — J449 Chronic obstructive pulmonary disease, unspecified: Secondary | ICD-10-CM | POA: Diagnosis not present

## 2017-07-16 DIAGNOSIS — I739 Peripheral vascular disease, unspecified: Secondary | ICD-10-CM | POA: Diagnosis not present

## 2017-07-16 DIAGNOSIS — I10 Essential (primary) hypertension: Secondary | ICD-10-CM | POA: Diagnosis not present

## 2017-07-22 DIAGNOSIS — R0602 Shortness of breath: Secondary | ICD-10-CM | POA: Diagnosis not present

## 2017-07-23 ENCOUNTER — Other Ambulatory Visit: Payer: Self-pay | Admitting: Internal Medicine

## 2017-07-23 DIAGNOSIS — R0602 Shortness of breath: Secondary | ICD-10-CM

## 2017-07-24 ENCOUNTER — Inpatient Hospital Stay: Admission: RE | Admit: 2017-07-24 | Payer: Self-pay | Source: Ambulatory Visit

## 2017-07-24 ENCOUNTER — Ambulatory Visit: Admission: RE | Admit: 2017-07-24 | Payer: Self-pay | Source: Ambulatory Visit

## 2017-07-24 ENCOUNTER — Ambulatory Visit
Admission: RE | Admit: 2017-07-24 | Discharge: 2017-07-24 | Disposition: A | Discharge status: Home / Self Care | Payer: Medicare HMO | Source: Ambulatory Visit | Attending: Internal Medicine | Admitting: Internal Medicine

## 2017-07-24 DIAGNOSIS — I700 Atherosclerosis of aorta: Secondary | ICD-10-CM | POA: Insufficient documentation

## 2017-07-24 DIAGNOSIS — D3502 Benign neoplasm of left adrenal gland: Secondary | ICD-10-CM | POA: Insufficient documentation

## 2017-07-24 DIAGNOSIS — R0602 Shortness of breath: Secondary | ICD-10-CM | POA: Insufficient documentation

## 2017-07-24 DIAGNOSIS — I2510 Atherosclerotic heart disease of native coronary artery without angina pectoris: Secondary | ICD-10-CM | POA: Insufficient documentation

## 2017-07-24 HISTORY — DX: Type 2 diabetes mellitus without complications: E11.9

## 2017-07-24 MED ORDER — IOPAMIDOL (ISOVUE-370) INJECTION 76%
75.0000 mL | Freq: Once | INTRAVENOUS | Status: AC | PRN
  Administered 2017-07-24: 75 mL via INTRAVENOUS

## 2017-08-12 DIAGNOSIS — I739 Peripheral vascular disease, unspecified: Secondary | ICD-10-CM | POA: Diagnosis not present

## 2017-08-12 DIAGNOSIS — I2510 Atherosclerotic heart disease of native coronary artery without angina pectoris: Secondary | ICD-10-CM | POA: Diagnosis not present

## 2017-08-12 DIAGNOSIS — I10 Essential (primary) hypertension: Secondary | ICD-10-CM | POA: Diagnosis not present

## 2017-08-12 DIAGNOSIS — I2584 Coronary atherosclerosis due to calcified coronary lesion: Secondary | ICD-10-CM | POA: Diagnosis not present

## 2017-08-12 DIAGNOSIS — I700 Atherosclerosis of aorta: Secondary | ICD-10-CM | POA: Diagnosis not present

## 2017-08-12 DIAGNOSIS — E119 Type 2 diabetes mellitus without complications: Secondary | ICD-10-CM | POA: Diagnosis not present

## 2017-08-12 DIAGNOSIS — E7800 Pure hypercholesterolemia, unspecified: Secondary | ICD-10-CM | POA: Diagnosis not present

## 2017-08-21 DIAGNOSIS — I208 Other forms of angina pectoris: Secondary | ICD-10-CM | POA: Diagnosis present

## 2017-08-26 ENCOUNTER — Other Ambulatory Visit: Payer: Self-pay

## 2017-08-26 ENCOUNTER — Observation Stay
Admission: RE | Admit: 2017-08-26 | Discharge: 2017-08-27 | Disposition: A | Discharge status: Home / Self Care | Payer: Medicare HMO | Source: Ambulatory Visit | Attending: Internal Medicine | Admitting: Internal Medicine

## 2017-08-26 ENCOUNTER — Encounter
Admission: RE | Disposition: A | Discharge status: Home / Self Care | Payer: Self-pay | Source: Ambulatory Visit | Attending: Internal Medicine

## 2017-08-26 DIAGNOSIS — K7010 Alcoholic hepatitis without ascites: Secondary | ICD-10-CM | POA: Insufficient documentation

## 2017-08-26 DIAGNOSIS — M1990 Unspecified osteoarthritis, unspecified site: Secondary | ICD-10-CM | POA: Diagnosis not present

## 2017-08-26 DIAGNOSIS — I2584 Coronary atherosclerosis due to calcified coronary lesion: Secondary | ICD-10-CM | POA: Diagnosis not present

## 2017-08-26 DIAGNOSIS — Z955 Presence of coronary angioplasty implant and graft: Secondary | ICD-10-CM

## 2017-08-26 DIAGNOSIS — I700 Atherosclerosis of aorta: Secondary | ICD-10-CM | POA: Diagnosis not present

## 2017-08-26 DIAGNOSIS — Z7982 Long term (current) use of aspirin: Secondary | ICD-10-CM | POA: Diagnosis not present

## 2017-08-26 DIAGNOSIS — J449 Chronic obstructive pulmonary disease, unspecified: Secondary | ICD-10-CM | POA: Diagnosis not present

## 2017-08-26 DIAGNOSIS — Z87891 Personal history of nicotine dependence: Secondary | ICD-10-CM | POA: Insufficient documentation

## 2017-08-26 DIAGNOSIS — I25110 Atherosclerotic heart disease of native coronary artery with unstable angina pectoris: Secondary | ICD-10-CM | POA: Diagnosis not present

## 2017-08-26 DIAGNOSIS — E1151 Type 2 diabetes mellitus with diabetic peripheral angiopathy without gangrene: Secondary | ICD-10-CM | POA: Diagnosis not present

## 2017-08-26 DIAGNOSIS — R079 Chest pain, unspecified: Secondary | ICD-10-CM | POA: Diagnosis present

## 2017-08-26 DIAGNOSIS — I208 Other forms of angina pectoris: Secondary | ICD-10-CM | POA: Diagnosis present

## 2017-08-26 DIAGNOSIS — I200 Unstable angina: Secondary | ICD-10-CM | POA: Diagnosis not present

## 2017-08-26 DIAGNOSIS — E7800 Pure hypercholesterolemia, unspecified: Secondary | ICD-10-CM | POA: Insufficient documentation

## 2017-08-26 DIAGNOSIS — I10 Essential (primary) hypertension: Secondary | ICD-10-CM | POA: Insufficient documentation

## 2017-08-26 DIAGNOSIS — I2510 Atherosclerotic heart disease of native coronary artery without angina pectoris: Secondary | ICD-10-CM | POA: Diagnosis not present

## 2017-08-26 DIAGNOSIS — I25119 Atherosclerotic heart disease of native coronary artery with unspecified angina pectoris: Secondary | ICD-10-CM | POA: Diagnosis not present

## 2017-08-26 HISTORY — PX: CORONARY STENT INTERVENTION: CATH118234

## 2017-08-26 HISTORY — PX: LEFT HEART CATH AND CORONARY ANGIOGRAPHY: CATH118249

## 2017-08-26 LAB — POCT ACTIVATED CLOTTING TIME: ACTIVATED CLOTTING TIME: 357 s

## 2017-08-26 SURGERY — LEFT HEART CATH AND CORONARY ANGIOGRAPHY
Anesthesia: Moderate Sedation

## 2017-08-26 MED ORDER — FENTANYL CITRATE (PF) 100 MCG/2ML IJ SOLN
INTRAMUSCULAR | Status: AC
  Filled 2017-08-26: qty 2

## 2017-08-26 MED ORDER — LOSARTAN POTASSIUM 50 MG PO TABS
100.0000 mg | ORAL_TABLET | Freq: Every day | ORAL | Status: DC
  Administered 2017-08-26 – 2017-08-27 (×2): 100 mg via ORAL
  Filled 2017-08-26 (×2): qty 2

## 2017-08-26 MED ORDER — SODIUM CHLORIDE 0.9% FLUSH: 3.0000 mL | INTRAVENOUS | Status: DC | PRN

## 2017-08-26 MED ORDER — ASPIRIN 81 MG PO CHEW
CHEWABLE_TABLET | ORAL | Status: AC
  Filled 2017-08-26: qty 4

## 2017-08-26 MED ORDER — NITROGLYCERIN 1 MG/10 ML FOR IR/CATH LAB
INTRA_ARTERIAL | Status: DC | PRN
  Administered 2017-08-26 (×3): 200 ug via INTRACORONARY

## 2017-08-26 MED ORDER — SODIUM CHLORIDE 0.9 % WEIGHT BASED INFUSION
3.0000 mL/kg/h | INTRAVENOUS | Status: AC
  Administered 2017-08-26: 3 mL/kg/h via INTRAVENOUS

## 2017-08-26 MED ORDER — TICAGRELOR 90 MG PO TABS
90.0000 mg | ORAL_TABLET | Freq: Two times a day (BID) | ORAL | Status: DC
  Administered 2017-08-26 – 2017-08-27 (×2): 90 mg via ORAL
  Filled 2017-08-26 (×2): qty 1

## 2017-08-26 MED ORDER — IOPAMIDOL (ISOVUE-300) INJECTION 61%
INTRAVENOUS | Status: DC | PRN
  Administered 2017-08-26: 150 mL via INTRAVENOUS

## 2017-08-26 MED ORDER — LABETALOL HCL 5 MG/ML IV SOLN: 10.0000 mg | INTRAVENOUS | Status: AC | PRN

## 2017-08-26 MED ORDER — SODIUM CHLORIDE 0.9 % WEIGHT BASED INFUSION
1.0000 mL/kg/h | INTRAVENOUS | Status: AC
  Administered 2017-08-26 (×2): 1 mL/kg/h via INTRAVENOUS

## 2017-08-26 MED ORDER — SODIUM CHLORIDE 0.9% FLUSH: 3.0000 mL | Freq: Two times a day (BID) | INTRAVENOUS | Status: DC

## 2017-08-26 MED ORDER — NITROGLYCERIN 5 MG/ML IV SOLN
INTRAVENOUS | Status: AC
  Filled 2017-08-26: qty 10

## 2017-08-26 MED ORDER — SODIUM CHLORIDE 0.9 % WEIGHT BASED INFUSION: 1.0000 mL/kg/h | INTRAVENOUS | Status: DC

## 2017-08-26 MED ORDER — ONDANSETRON HCL 4 MG/2ML IJ SOLN: 4.0000 mg | Freq: Four times a day (QID) | INTRAMUSCULAR | Status: DC | PRN

## 2017-08-26 MED ORDER — SERTRALINE HCL 50 MG PO TABS
50.0000 mg | ORAL_TABLET | Freq: Every day | ORAL | Status: DC
  Administered 2017-08-27: 50 mg via ORAL
  Filled 2017-08-26 (×2): qty 1

## 2017-08-26 MED ORDER — BIVALIRUDIN BOLUS VIA INFUSION - CUPID
INTRAVENOUS | Status: DC | PRN
  Administered 2017-08-26: 61.95 mg via INTRAVENOUS

## 2017-08-26 MED ORDER — AMLODIPINE BESYLATE 5 MG PO TABS
10.0000 mg | ORAL_TABLET | Freq: Every day | ORAL | Status: DC
  Administered 2017-08-27: 10 mg via ORAL
  Filled 2017-08-26 (×2): qty 2

## 2017-08-26 MED ORDER — MIDAZOLAM HCL 2 MG/2ML IJ SOLN
INTRAMUSCULAR | Status: DC | PRN
  Administered 2017-08-26: 1 mg via INTRAVENOUS

## 2017-08-26 MED ORDER — TICAGRELOR 90 MG PO TABS
ORAL_TABLET | ORAL | Status: AC
  Filled 2017-08-26: qty 2

## 2017-08-26 MED ORDER — SODIUM CHLORIDE 0.9 % IV SOLN: 250.0000 mL | INTRAVENOUS | Status: DC | PRN

## 2017-08-26 MED ORDER — HEPARIN (PORCINE) IN NACL 2-0.9 UNIT/ML-% IJ SOLN
INTRAMUSCULAR | Status: AC
  Filled 2017-08-26: qty 500

## 2017-08-26 MED ORDER — ASPIRIN 81 MG PO CHEW
81.0000 mg | CHEWABLE_TABLET | Freq: Every day | ORAL | Status: DC
  Administered 2017-08-27: 81 mg via ORAL
  Filled 2017-08-26: qty 1

## 2017-08-26 MED ORDER — HYDRALAZINE HCL 20 MG/ML IJ SOLN: 5.0000 mg | INTRAMUSCULAR | Status: AC | PRN

## 2017-08-26 MED ORDER — ASPIRIN 81 MG PO CHEW
CHEWABLE_TABLET | ORAL | Status: DC | PRN
  Administered 2017-08-26: 324 mg via ORAL

## 2017-08-26 MED ORDER — SODIUM CHLORIDE 0.9 % IV SOLN
INTRAVENOUS | Status: AC | PRN
  Administered 2017-08-26: 1.75 mg/kg/h via INTRAVENOUS

## 2017-08-26 MED ORDER — TICAGRELOR 90 MG PO TABS
ORAL_TABLET | ORAL | Status: DC | PRN
  Administered 2017-08-26: 180 mg via ORAL

## 2017-08-26 MED ORDER — ASPIRIN 81 MG PO CHEW: 81.0000 mg | CHEWABLE_TABLET | ORAL | Status: DC

## 2017-08-26 MED ORDER — BIVALIRUDIN TRIFLUOROACETATE 250 MG IV SOLR
INTRAVENOUS | Status: AC
  Filled 2017-08-26: qty 250

## 2017-08-26 MED ORDER — ACETAMINOPHEN 325 MG PO TABS: 650.0000 mg | ORAL_TABLET | ORAL | Status: DC | PRN

## 2017-08-26 MED ORDER — MIDAZOLAM HCL 2 MG/2ML IJ SOLN
INTRAMUSCULAR | Status: AC
  Filled 2017-08-26: qty 2

## 2017-08-26 MED ORDER — FENTANYL CITRATE (PF) 100 MCG/2ML IJ SOLN
INTRAMUSCULAR | Status: DC | PRN
  Administered 2017-08-26: 50 ug via INTRAVENOUS

## 2017-08-26 MED ORDER — ATORVASTATIN CALCIUM 20 MG PO TABS
40.0000 mg | ORAL_TABLET | Freq: Every day | ORAL | Status: DC
  Administered 2017-08-26: 40 mg via ORAL
  Filled 2017-08-26: qty 2

## 2017-08-26 MED ORDER — SODIUM CHLORIDE 0.9 % IV SOLN: 0.2500 mg/kg/h | INTRAVENOUS | Status: AC

## 2017-08-26 MED ORDER — IOPAMIDOL (ISOVUE-300) INJECTION 61%
INTRAVENOUS | Status: DC | PRN
  Administered 2017-08-26: 130 mL via INTRA_ARTERIAL

## 2017-08-26 MED ORDER — SODIUM CHLORIDE 0.9% FLUSH
3.0000 mL | Freq: Two times a day (BID) | INTRAVENOUS | Status: DC
  Administered 2017-08-27 (×2): 3 mL via INTRAVENOUS

## 2017-08-26 SURGICAL SUPPLY — 16 items
CATH INFINITI 5FR ANG PIGTAIL (CATHETERS) ×2 IMPLANT
CATH INFINITI 5FR JL4 (CATHETERS) ×2 IMPLANT
CATH INFINITI JR4 5F (CATHETERS) ×2 IMPLANT
CATH VISTA GUIDE 6FR XB3.5 (CATHETERS) ×2 IMPLANT
DEVICE CLOSURE MYNXGRIP 6/7F (Vascular Products) ×2 IMPLANT
DEVICE INFLAT 30 PLUS (MISCELLANEOUS) ×2 IMPLANT
KIT MANI 3VAL PERCEP (MISCELLANEOUS) ×4 IMPLANT
NDL PERC 18GX7CM (NEEDLE) IMPLANT
NEEDLE PERC 18GX7CM (NEEDLE) ×4 IMPLANT
PACK CARDIAC CATH (CUSTOM PROCEDURE TRAY) ×4 IMPLANT
SHEATH AVANTI 6FR X 11CM (SHEATH) ×2 IMPLANT
SHEATH PINNACLE 5F 10CM (SHEATH) ×2 IMPLANT
STENT SIERRA 2.50 X 12 MM (Permanent Stent) ×2 IMPLANT
WIRE G HI TQ BMW 190 (WIRE) ×2 IMPLANT
WIRE GUIDERIGHT .035X150 (WIRE) ×2 IMPLANT
WIRE HI TORQ TRAVERSE ST 190CM (WIRE) ×2 IMPLANT

## 2017-08-26 NOTE — Plan of Care (Signed)
Pt admitted this shift from specials recovery s/p cardiac catheterization. R groin site CDI. A&Ox4. VSS . RA . Family at bedside. NSR on monitor. IVF infusing per order. No complaints thus far. Will continue to monitor and report to oncoming RN .  Progressing Education: Knowledge of General Education information will improve 08/26/2017 1808 - Progressing by Aleen Campi, RN Health Behavior/Discharge Planning: Ability to manage health-related needs will improve 08/26/2017 1808 - Progressing by Aleen Campi, RN Clinical Measurements: Ability to maintain clinical measurements within normal limits will improve 08/26/2017 1808 - Progressing by Aleen Campi, RN Will remain free from infection 08/26/2017 1808 - Progressing by Aleen Campi, RN Diagnostic test results will improve 08/26/2017 1808 - Progressing by Aleen Campi, RN Respiratory complications will improve 08/26/2017 1808 - Progressing by Aleen Campi, RN Cardiovascular complication will be avoided 08/26/2017 1808 - Progressing by Aleen Campi, RN Activity: Risk for activity intolerance will decrease 08/26/2017 1808 - Progressing by Aleen Campi, RN Nutrition: Adequate nutrition will be maintained 08/26/2017 1808 - Progressing by Aleen Campi, RN Coping: Level of anxiety will decrease 08/26/2017 1808 - Progressing by Aleen Campi, RN Elimination: Will not experience complications related to bowel motility 08/26/2017 1808 - Progressing by Aleen Campi, RN Will not experience complications related to urinary retention 08/26/2017 1808 - Progressing by Aleen Campi, RN Pain Managment: General experience of comfort will improve 08/26/2017 1808 - Progressing by Aleen Campi, RN Safety: Ability to remain free from injury will improve 08/26/2017 1808 - Progressing by Aleen Campi, RN Skin Integrity: Risk for impaired skin integrity will decrease 08/26/2017 1808 - Progressing by Aleen Campi, RN

## 2017-08-26 NOTE — Plan of Care (Signed)
  Progressing Clinical Measurements: Ability to maintain clinical measurements within normal limits will improve 08/26/2017 2349 - Progressing by Liliane Channel, RN Will remain free from infection 08/26/2017 2349 - Progressing by Liliane Channel, RN Cardiovascular complication will be avoided 08/26/2017 2349 - Progressing by Liliane Channel, RN Pain Managment: General experience of comfort will improve 08/26/2017 2349 - Progressing by Liliane Channel, RN Safety: Ability to remain free from injury will improve 08/26/2017 2349 - Progressing by Liliane Channel, RN Skin Integrity: Risk for impaired skin integrity will decrease 08/26/2017 2349 - Progressing by Liliane Channel, RN

## 2017-08-27 ENCOUNTER — Encounter: Payer: Self-pay | Admitting: Internal Medicine

## 2017-08-27 DIAGNOSIS — I2584 Coronary atherosclerosis due to calcified coronary lesion: Secondary | ICD-10-CM | POA: Diagnosis not present

## 2017-08-27 DIAGNOSIS — I10 Essential (primary) hypertension: Secondary | ICD-10-CM | POA: Diagnosis not present

## 2017-08-27 DIAGNOSIS — J449 Chronic obstructive pulmonary disease, unspecified: Secondary | ICD-10-CM | POA: Diagnosis not present

## 2017-08-27 DIAGNOSIS — I700 Atherosclerosis of aorta: Secondary | ICD-10-CM | POA: Diagnosis not present

## 2017-08-27 DIAGNOSIS — M1990 Unspecified osteoarthritis, unspecified site: Secondary | ICD-10-CM | POA: Diagnosis not present

## 2017-08-27 DIAGNOSIS — I25110 Atherosclerotic heart disease of native coronary artery with unstable angina pectoris: Secondary | ICD-10-CM | POA: Diagnosis not present

## 2017-08-27 DIAGNOSIS — E7800 Pure hypercholesterolemia, unspecified: Secondary | ICD-10-CM | POA: Diagnosis not present

## 2017-08-27 DIAGNOSIS — E1151 Type 2 diabetes mellitus with diabetic peripheral angiopathy without gangrene: Secondary | ICD-10-CM | POA: Diagnosis not present

## 2017-08-27 DIAGNOSIS — I25119 Atherosclerotic heart disease of native coronary artery with unspecified angina pectoris: Secondary | ICD-10-CM | POA: Diagnosis not present

## 2017-08-27 DIAGNOSIS — K7010 Alcoholic hepatitis without ascites: Secondary | ICD-10-CM | POA: Diagnosis not present

## 2017-08-27 MED ORDER — TICAGRELOR 90 MG PO TABS: 90.0000 mg | ORAL_TABLET | Freq: Two times a day (BID) | ORAL | 4 refills | Status: DC

## 2017-08-27 NOTE — Progress Notes (Signed)
Patient is being discharged to home with his daughter.  Instructions given regarding site care, following, med changes, and diet changes.  Site shows no signs of bleeding.  Surrounding skin is soft.

## 2017-08-27 NOTE — Progress Notes (Signed)
Patient came in for outpatient cardiac catheterization yesterday due to progressive angina.  Stress test consistent with ischemic chest pain.  CAD risk factors include HTN and HLD.  Cath was performed, which revealed EF of 55%.  Patient with severe one vessel coronary atherosclerosis with significant 2nd obtuse marginal stenosis requiring PCI.   Procedures   CORONARY STENT INTERVENTION  Conclusion     Prox LAD lesion is 55% stenosed.  Ost 2nd Mrg to 2nd Mrg lesion is 90% stenosed.  Post intervention, there is a 0% residual stenosis.  A drug-eluting stent was successfully placed using a STENT SIERRA 2.50 X 12 MM.   Conclusion Successful PCI and stent to OM 2 direct stent with 2.5 x 12 mm xience sierra [place] was reduced from 90% down to 0    Patient for discharge today.  Patient referred to Cardiac Rehab with dx of Coronary Stent.   Patient sitting up in bed.  Daughter, who is an Hospital doctor, is at bedside.    Reviewed definition of CAD and what that means.  Location of stent discussed with patient.  Informed patient the nurse will provide him with the stent card and instructed him to keep with him at all times. ?Patient stated he already has his stent card.  Risk factors of heart disease reviewed with patient. Explained the importance of controlling BP, cholesterol, and blood sugar; maintaining a healthy weight; smoking cessation - if applicable, and exercise.? ? Smoking cessation -  patient is a former smoker. Patient reported quitting 3 years ago.    Reviewed cardiac medications, rationale for taking, and side effects.  Discussed the importance of following a low sodium, low fat, low cholesterol heart healthy diet.??  Exercise discussed.   Explained to patient and daughter that patient has been referred to Cardiac Rehab by Dr. Nehemiah Massed.  Overview of program provided.   Patient does not currently exercise as he has knee problems; right ankle has been fused; and problems with his  shoulder. Patient's daughter does not think patient would be able to participate given his joint and leg problems.  Patient is declining to participate in Cardiac Rehab.    Patient and daughter thanked me for the above information.    Roanna Epley, RN, BSN, [facility] Cardiovascular and Pulmonary Nurse Navigator

## 2017-08-27 NOTE — Care Management (Signed)
Patient to discharge on Brilinta. He verbalized confirmed pharmacy coverage with insurance.  Provided with coupon for 30 day trial.

## 2017-08-27 NOTE — Discharge Summary (Signed)
[facility] Discharge Summary  Patient ID: Jack Henson MRN: [id number] DOB/AGE: 1950/12/27 67 y.o.  Admit date: 08/26/2017 Discharge date: 08/27/2017  Primary Discharge Diagnosis: Ischemic chest pain I20.9 Secondary Discharge Diagnosis high blood pressure and high cholesterol  Significant Diagnostic Studies: Cardiac cath with left ventricular angiogram and selective coronary injection as well as PCI and stent placement of 2nd obtuse marginal.  Hospital Course: The patient was admitted to specials for cardiac cath with selective coronary angiogram after full consent, risk and benefits explained, and time out called with all approprate details voiced and discussed. The patient has had progressive canadian class 3 angina with high probability risk stress test consistent with ischemic chest pain and or anginal equivalent with coronary artery risk factors including high blood pressure and high cholesterol. The procedure was performed without complication and it revealed normal left ventricular function with ejection fraction of 55%.  It was found that the patient had severe 1 vessel coronary atherosclerosis with significant 2nd obtuse marginal stenosis requiring further intervention. Therefore, the patient had a PCI and drug eluding  stent placed without complication. The patient has been ambulating without further significant symptoms and has reached his maximal hospital benefit and will be discharged to home in good condition.  Cardiac rehabilitation has been discussed and recommended. Medication management of cardiovascular risk factors will be given post discharge and modified as an outpatient.   Discharge Exam: Blood pressure (!) 150/72, pulse 72, temperature 98 F (36.7 C), temperature source Oral, resp. rate 18, height 5\' 6"  (1.676 m), weight 176 lb 12.8 oz (80.2 kg), SpO2 97 %.  Constitutional: Alet oriented to person, place, and time. No distress.  HENT: No nasal  discharge.  Head: Normocephalic and atraumatic.  Eyes: Pupils are equal and round. No discharge.  Neck: Normal range of motion. Neck supple. No JVD present. No thyromegaly present.  Cardiovascular: Normal rate, regular rhythm, normal S1 S2, no gallop, no friction rub. No murmur Pulmonary/Chest: Effort normal, No stridor. No respiratory distress. no wheezes.  no rales.    Abdominal: Soft. Bowel sounds are normal.  no distension.  no tenderness. There is no rebound and no guarding.  Musculoskeletal: No edema, no cyanosis, normal pulses, no bleeding, Normal range of motion. no tenderness.  Neurological:  alert and oriented to person, place, and time. Coordination normal.  Skin: Skin is warm and dry. No rash noted. No erythema. No pallor.  Psychiatric:  normal mood and affect. behavior is normal.    Labs:  No results found for: WBC, HGB, HCT, MCV, PLT No results for input(s): NA, K, CL, CO2, BUN, CREATININE, CALCIUM, PROT, BILITOT, ALKPHOS, ALT, AST, GLUCOSE in the last 168 hours.  Invalid input(s): LABALBU  EKG: NSR without evidence of new changes  FOLLOW UP IN ONE TO TWO WEEKS Discharge Instructions    AMB Referral to Cardiac Rehabilitation - Phase II   Complete by:  As directed    Diagnosis:  Coronary Stents     Allergies as of 08/27/2017      Reactions   Aspirin Other (See Comments)   Hepatitis and alcohol abuse      Medication List    STOP taking these medications   BC HEADACHE POWDER PO   CINNAMON PO   HONEY PO   NON FORMULARY   NON FORMULARY     TAKE these medications   amLODipine 10 MG tablet Commonly known as:  NORVASC Take 10 mg by mouth daily.   aspirin EC 81 MG  tablet Take 81 mg by mouth daily.   atorvastatin 10 MG tablet Commonly known as:  LIPITOR Take 10 mg by mouth daily.   losartan 100 MG tablet Commonly known as:  COZAAR TAKE 1 TABLET ONE TIME DAILY   sertraline 50 MG tablet Commonly known as:  ZOLOFT Take 50 mg by mouth daily.         THE PATIENT  SHALL BRING ALL MEDICATIONS TO FOLLOW UP APPOINTMENT  Signed:  Corey Skains MD, [facility] 08/27/2017, 9:11 AM

## 2017-08-27 NOTE — Care Management Obs Status (Signed)
[facility] NOTIFICATION   Patient Details  Name: Jack Henson MRN: [id number] Date of Birth: [birth year]   Medicare Observation Status Notification Given:  No Discharge order placed in < 24hr of being placed on observation    Katrina Stack, RN 08/27/2017, 9:30 AM

## 2017-09-02 DIAGNOSIS — I2510 Atherosclerotic heart disease of native coronary artery without angina pectoris: Secondary | ICD-10-CM | POA: Diagnosis not present

## 2017-09-02 DIAGNOSIS — I10 Essential (primary) hypertension: Secondary | ICD-10-CM | POA: Diagnosis not present

## 2017-09-02 DIAGNOSIS — E7800 Pure hypercholesterolemia, unspecified: Secondary | ICD-10-CM | POA: Diagnosis not present

## 2017-09-30 DIAGNOSIS — I10 Essential (primary) hypertension: Secondary | ICD-10-CM | POA: Diagnosis not present

## 2017-09-30 DIAGNOSIS — I2510 Atherosclerotic heart disease of native coronary artery without angina pectoris: Secondary | ICD-10-CM | POA: Diagnosis not present

## 2017-09-30 DIAGNOSIS — E7800 Pure hypercholesterolemia, unspecified: Secondary | ICD-10-CM | POA: Diagnosis not present

## 2017-11-25 DIAGNOSIS — E7800 Pure hypercholesterolemia, unspecified: Secondary | ICD-10-CM | POA: Diagnosis not present

## 2017-11-25 DIAGNOSIS — R42 Dizziness and giddiness: Secondary | ICD-10-CM | POA: Insufficient documentation

## 2017-11-25 DIAGNOSIS — R2681 Unsteadiness on feet: Secondary | ICD-10-CM | POA: Diagnosis not present

## 2017-11-25 DIAGNOSIS — I2510 Atherosclerotic heart disease of native coronary artery without angina pectoris: Secondary | ICD-10-CM | POA: Diagnosis not present

## 2018-01-14 DIAGNOSIS — E119 Type 2 diabetes mellitus without complications: Secondary | ICD-10-CM | POA: Diagnosis not present

## 2018-01-14 DIAGNOSIS — I10 Essential (primary) hypertension: Secondary | ICD-10-CM | POA: Diagnosis not present

## 2018-01-21 DIAGNOSIS — E119 Type 2 diabetes mellitus without complications: Secondary | ICD-10-CM | POA: Diagnosis not present

## 2018-01-21 DIAGNOSIS — I739 Peripheral vascular disease, unspecified: Secondary | ICD-10-CM | POA: Diagnosis not present

## 2018-01-21 DIAGNOSIS — I700 Atherosclerosis of aorta: Secondary | ICD-10-CM | POA: Diagnosis not present

## 2018-01-21 DIAGNOSIS — Z0000 Encounter for general adult medical examination without abnormal findings: Secondary | ICD-10-CM | POA: Diagnosis not present

## 2018-01-21 DIAGNOSIS — F3341 Major depressive disorder, recurrent, in partial remission: Secondary | ICD-10-CM | POA: Diagnosis not present

## 2018-01-21 DIAGNOSIS — I10 Essential (primary) hypertension: Secondary | ICD-10-CM | POA: Diagnosis not present

## 2018-01-21 DIAGNOSIS — I2510 Atherosclerotic heart disease of native coronary artery without angina pectoris: Secondary | ICD-10-CM | POA: Diagnosis not present

## 2018-01-21 DIAGNOSIS — F1020 Alcohol dependence, uncomplicated: Secondary | ICD-10-CM | POA: Diagnosis not present

## 2018-01-21 DIAGNOSIS — J449 Chronic obstructive pulmonary disease, unspecified: Secondary | ICD-10-CM | POA: Diagnosis not present

## 2018-02-04 ENCOUNTER — Ambulatory Visit (INDEPENDENT_AMBULATORY_CARE_PROVIDER_SITE_OTHER): Payer: Self-pay | Admitting: Vascular Surgery

## 2018-02-04 ENCOUNTER — Encounter (INDEPENDENT_AMBULATORY_CARE_PROVIDER_SITE_OTHER): Payer: Self-pay

## 2018-03-04 ENCOUNTER — Encounter (INDEPENDENT_AMBULATORY_CARE_PROVIDER_SITE_OTHER): Payer: Self-pay

## 2018-03-04 ENCOUNTER — Ambulatory Visit (INDEPENDENT_AMBULATORY_CARE_PROVIDER_SITE_OTHER): Payer: Self-pay | Admitting: Vascular Surgery

## 2018-07-22 DIAGNOSIS — J449 Chronic obstructive pulmonary disease, unspecified: Secondary | ICD-10-CM | POA: Diagnosis not present

## 2018-07-22 DIAGNOSIS — E119 Type 2 diabetes mellitus without complications: Secondary | ICD-10-CM | POA: Diagnosis not present

## 2018-07-22 DIAGNOSIS — I2510 Atherosclerotic heart disease of native coronary artery without angina pectoris: Secondary | ICD-10-CM | POA: Diagnosis not present

## 2018-07-22 DIAGNOSIS — I10 Essential (primary) hypertension: Secondary | ICD-10-CM | POA: Diagnosis not present

## 2018-07-29 DIAGNOSIS — I10 Essential (primary) hypertension: Secondary | ICD-10-CM | POA: Diagnosis not present

## 2018-07-29 DIAGNOSIS — E7800 Pure hypercholesterolemia, unspecified: Secondary | ICD-10-CM | POA: Diagnosis not present

## 2018-07-29 DIAGNOSIS — F3341 Major depressive disorder, recurrent, in partial remission: Secondary | ICD-10-CM | POA: Diagnosis not present

## 2018-07-29 DIAGNOSIS — I739 Peripheral vascular disease, unspecified: Secondary | ICD-10-CM | POA: Diagnosis not present

## 2018-07-29 DIAGNOSIS — I700 Atherosclerosis of aorta: Secondary | ICD-10-CM | POA: Diagnosis not present

## 2018-07-29 DIAGNOSIS — F1020 Alcohol dependence, uncomplicated: Secondary | ICD-10-CM | POA: Diagnosis not present

## 2018-07-29 DIAGNOSIS — I2510 Atherosclerotic heart disease of native coronary artery without angina pectoris: Secondary | ICD-10-CM | POA: Diagnosis not present

## 2018-07-29 DIAGNOSIS — E119 Type 2 diabetes mellitus without complications: Secondary | ICD-10-CM | POA: Diagnosis not present

## 2018-07-29 DIAGNOSIS — Z9989 Dependence on other enabling machines and devices: Secondary | ICD-10-CM | POA: Insufficient documentation

## 2018-07-29 DIAGNOSIS — J449 Chronic obstructive pulmonary disease, unspecified: Secondary | ICD-10-CM | POA: Diagnosis not present

## 2019-01-20 DIAGNOSIS — I2510 Atherosclerotic heart disease of native coronary artery without angina pectoris: Secondary | ICD-10-CM | POA: Diagnosis not present

## 2019-01-20 DIAGNOSIS — E119 Type 2 diabetes mellitus without complications: Secondary | ICD-10-CM | POA: Diagnosis not present

## 2019-01-20 DIAGNOSIS — E7800 Pure hypercholesterolemia, unspecified: Secondary | ICD-10-CM | POA: Diagnosis not present

## 2019-01-20 DIAGNOSIS — Z125 Encounter for screening for malignant neoplasm of prostate: Secondary | ICD-10-CM | POA: Diagnosis not present

## 2019-01-27 DIAGNOSIS — I739 Peripheral vascular disease, unspecified: Secondary | ICD-10-CM | POA: Diagnosis not present

## 2019-01-27 DIAGNOSIS — J449 Chronic obstructive pulmonary disease, unspecified: Secondary | ICD-10-CM | POA: Diagnosis not present

## 2019-01-27 DIAGNOSIS — I2510 Atherosclerotic heart disease of native coronary artery without angina pectoris: Secondary | ICD-10-CM | POA: Diagnosis not present

## 2019-01-27 DIAGNOSIS — F1020 Alcohol dependence, uncomplicated: Secondary | ICD-10-CM | POA: Diagnosis not present

## 2019-01-27 DIAGNOSIS — Z0000 Encounter for general adult medical examination without abnormal findings: Secondary | ICD-10-CM | POA: Diagnosis not present

## 2019-01-27 DIAGNOSIS — F3341 Major depressive disorder, recurrent, in partial remission: Secondary | ICD-10-CM | POA: Diagnosis not present

## 2019-01-27 DIAGNOSIS — E7800 Pure hypercholesterolemia, unspecified: Secondary | ICD-10-CM | POA: Diagnosis not present

## 2019-01-27 DIAGNOSIS — I700 Atherosclerosis of aorta: Secondary | ICD-10-CM | POA: Diagnosis not present

## 2019-01-27 DIAGNOSIS — E119 Type 2 diabetes mellitus without complications: Secondary | ICD-10-CM | POA: Diagnosis not present

## 2019-02-24 DIAGNOSIS — Z0000 Encounter for general adult medical examination without abnormal findings: Secondary | ICD-10-CM | POA: Diagnosis not present

## 2019-08-04 DIAGNOSIS — I2510 Atherosclerotic heart disease of native coronary artery without angina pectoris: Secondary | ICD-10-CM | POA: Diagnosis not present

## 2019-08-04 DIAGNOSIS — E119 Type 2 diabetes mellitus without complications: Secondary | ICD-10-CM | POA: Diagnosis not present

## 2019-08-04 DIAGNOSIS — J449 Chronic obstructive pulmonary disease, unspecified: Secondary | ICD-10-CM | POA: Diagnosis not present

## 2019-08-11 DIAGNOSIS — I2510 Atherosclerotic heart disease of native coronary artery without angina pectoris: Secondary | ICD-10-CM | POA: Diagnosis not present

## 2019-08-11 DIAGNOSIS — I10 Essential (primary) hypertension: Secondary | ICD-10-CM | POA: Diagnosis not present

## 2019-08-11 DIAGNOSIS — E119 Type 2 diabetes mellitus without complications: Secondary | ICD-10-CM | POA: Diagnosis not present

## 2019-08-11 DIAGNOSIS — F1020 Alcohol dependence, uncomplicated: Secondary | ICD-10-CM | POA: Diagnosis not present

## 2019-08-11 DIAGNOSIS — Z79899 Other long term (current) drug therapy: Secondary | ICD-10-CM | POA: Diagnosis not present

## 2019-08-11 DIAGNOSIS — J449 Chronic obstructive pulmonary disease, unspecified: Secondary | ICD-10-CM | POA: Diagnosis not present

## 2019-08-11 DIAGNOSIS — F3341 Major depressive disorder, recurrent, in partial remission: Secondary | ICD-10-CM | POA: Diagnosis not present

## 2019-08-11 DIAGNOSIS — Z9989 Dependence on other enabling machines and devices: Secondary | ICD-10-CM | POA: Diagnosis not present

## 2019-12-08 DIAGNOSIS — E119 Type 2 diabetes mellitus without complications: Secondary | ICD-10-CM | POA: Diagnosis not present

## 2019-12-08 DIAGNOSIS — I10 Essential (primary) hypertension: Secondary | ICD-10-CM | POA: Diagnosis not present

## 2019-12-08 DIAGNOSIS — I2510 Atherosclerotic heart disease of native coronary artery without angina pectoris: Secondary | ICD-10-CM | POA: Diagnosis not present

## 2019-12-15 DIAGNOSIS — F1020 Alcohol dependence, uncomplicated: Secondary | ICD-10-CM | POA: Diagnosis not present

## 2019-12-15 DIAGNOSIS — I700 Atherosclerosis of aorta: Secondary | ICD-10-CM | POA: Diagnosis not present

## 2019-12-15 DIAGNOSIS — I2510 Atherosclerotic heart disease of native coronary artery without angina pectoris: Secondary | ICD-10-CM | POA: Diagnosis not present

## 2019-12-15 DIAGNOSIS — E119 Type 2 diabetes mellitus without complications: Secondary | ICD-10-CM | POA: Diagnosis not present

## 2019-12-15 DIAGNOSIS — I10 Essential (primary) hypertension: Secondary | ICD-10-CM | POA: Diagnosis not present

## 2019-12-15 DIAGNOSIS — I739 Peripheral vascular disease, unspecified: Secondary | ICD-10-CM | POA: Diagnosis not present

## 2019-12-15 DIAGNOSIS — Z9989 Dependence on other enabling machines and devices: Secondary | ICD-10-CM | POA: Diagnosis not present

## 2019-12-15 DIAGNOSIS — J449 Chronic obstructive pulmonary disease, unspecified: Secondary | ICD-10-CM | POA: Diagnosis not present

## 2019-12-15 DIAGNOSIS — Z135 Encounter for screening for eye and ear disorders: Secondary | ICD-10-CM | POA: Diagnosis not present

## 2019-12-15 DIAGNOSIS — F3341 Major depressive disorder, recurrent, in partial remission: Secondary | ICD-10-CM | POA: Diagnosis not present

## 2020-02-05 DIAGNOSIS — E119 Type 2 diabetes mellitus without complications: Secondary | ICD-10-CM | POA: Diagnosis not present

## 2020-02-17 DIAGNOSIS — Z0100 Encounter for examination of eyes and vision without abnormal findings: Secondary | ICD-10-CM | POA: Diagnosis not present

## 2020-04-12 DIAGNOSIS — I2510 Atherosclerotic heart disease of native coronary artery without angina pectoris: Secondary | ICD-10-CM | POA: Diagnosis not present

## 2020-04-12 DIAGNOSIS — E119 Type 2 diabetes mellitus without complications: Secondary | ICD-10-CM | POA: Diagnosis not present

## 2020-04-19 DIAGNOSIS — F1020 Alcohol dependence, uncomplicated: Secondary | ICD-10-CM | POA: Diagnosis not present

## 2020-04-19 DIAGNOSIS — Z0000 Encounter for general adult medical examination without abnormal findings: Secondary | ICD-10-CM | POA: Diagnosis not present

## 2020-04-19 DIAGNOSIS — I700 Atherosclerosis of aorta: Secondary | ICD-10-CM | POA: Diagnosis not present

## 2020-04-19 DIAGNOSIS — F3341 Major depressive disorder, recurrent, in partial remission: Secondary | ICD-10-CM | POA: Diagnosis not present

## 2020-04-19 DIAGNOSIS — Z23 Encounter for immunization: Secondary | ICD-10-CM | POA: Diagnosis not present

## 2020-04-19 DIAGNOSIS — E1151 Type 2 diabetes mellitus with diabetic peripheral angiopathy without gangrene: Secondary | ICD-10-CM | POA: Diagnosis not present

## 2020-04-19 DIAGNOSIS — J449 Chronic obstructive pulmonary disease, unspecified: Secondary | ICD-10-CM | POA: Diagnosis not present

## 2020-04-19 DIAGNOSIS — I2510 Atherosclerotic heart disease of native coronary artery without angina pectoris: Secondary | ICD-10-CM | POA: Diagnosis not present

## 2020-04-19 DIAGNOSIS — I10 Essential (primary) hypertension: Secondary | ICD-10-CM | POA: Diagnosis not present

## 2020-06-08 DIAGNOSIS — L821 Other seborrheic keratosis: Secondary | ICD-10-CM | POA: Diagnosis not present

## 2020-06-08 DIAGNOSIS — L578 Other skin changes due to chronic exposure to nonionizing radiation: Secondary | ICD-10-CM | POA: Diagnosis not present

## 2020-06-08 DIAGNOSIS — M6740 Ganglion, unspecified site: Secondary | ICD-10-CM | POA: Diagnosis not present

## 2020-06-08 DIAGNOSIS — I872 Venous insufficiency (chronic) (peripheral): Secondary | ICD-10-CM | POA: Diagnosis not present

## 2020-06-08 DIAGNOSIS — L720 Epidermal cyst: Secondary | ICD-10-CM | POA: Diagnosis not present

## 2020-08-02 DIAGNOSIS — Z86010 Personal history of colonic polyps: Secondary | ICD-10-CM | POA: Diagnosis not present

## 2020-08-02 DIAGNOSIS — F1010 Alcohol abuse, uncomplicated: Secondary | ICD-10-CM | POA: Diagnosis not present

## 2020-08-02 DIAGNOSIS — K625 Hemorrhage of anus and rectum: Secondary | ICD-10-CM | POA: Diagnosis not present

## 2020-09-01 ENCOUNTER — Ambulatory Visit: Payer: Medicare HMO | Admitting: Dermatology

## 2020-09-06 DIAGNOSIS — Z01818 Encounter for other preprocedural examination: Secondary | ICD-10-CM | POA: Diagnosis not present

## 2020-09-09 DIAGNOSIS — D122 Benign neoplasm of ascending colon: Secondary | ICD-10-CM | POA: Diagnosis not present

## 2020-09-09 DIAGNOSIS — D120 Benign neoplasm of cecum: Secondary | ICD-10-CM | POA: Diagnosis not present

## 2020-09-09 DIAGNOSIS — K621 Rectal polyp: Secondary | ICD-10-CM | POA: Diagnosis not present

## 2020-09-09 DIAGNOSIS — Z86010 Personal history of colonic polyps: Secondary | ICD-10-CM | POA: Diagnosis not present

## 2020-09-09 DIAGNOSIS — K5730 Diverticulosis of large intestine without perforation or abscess without bleeding: Secondary | ICD-10-CM | POA: Diagnosis not present

## 2020-09-09 DIAGNOSIS — K640 First degree hemorrhoids: Secondary | ICD-10-CM | POA: Diagnosis not present

## 2020-09-09 DIAGNOSIS — D123 Benign neoplasm of transverse colon: Secondary | ICD-10-CM | POA: Diagnosis not present

## 2020-09-09 DIAGNOSIS — D126 Benign neoplasm of colon, unspecified: Secondary | ICD-10-CM | POA: Diagnosis not present

## 2020-09-09 DIAGNOSIS — K635 Polyp of colon: Secondary | ICD-10-CM | POA: Diagnosis not present

## 2020-10-18 DIAGNOSIS — Z125 Encounter for screening for malignant neoplasm of prostate: Secondary | ICD-10-CM | POA: Diagnosis not present

## 2020-10-18 DIAGNOSIS — I2510 Atherosclerotic heart disease of native coronary artery without angina pectoris: Secondary | ICD-10-CM | POA: Diagnosis not present

## 2020-10-18 DIAGNOSIS — E119 Type 2 diabetes mellitus without complications: Secondary | ICD-10-CM | POA: Diagnosis not present

## 2020-10-18 DIAGNOSIS — I10 Essential (primary) hypertension: Secondary | ICD-10-CM | POA: Diagnosis not present

## 2020-10-25 DIAGNOSIS — E1151 Type 2 diabetes mellitus with diabetic peripheral angiopathy without gangrene: Secondary | ICD-10-CM | POA: Diagnosis not present

## 2020-10-25 DIAGNOSIS — I10 Essential (primary) hypertension: Secondary | ICD-10-CM | POA: Diagnosis not present

## 2020-10-25 DIAGNOSIS — E7800 Pure hypercholesterolemia, unspecified: Secondary | ICD-10-CM | POA: Diagnosis not present

## 2020-10-25 DIAGNOSIS — F1020 Alcohol dependence, uncomplicated: Secondary | ICD-10-CM | POA: Diagnosis not present

## 2020-10-25 DIAGNOSIS — I2510 Atherosclerotic heart disease of native coronary artery without angina pectoris: Secondary | ICD-10-CM | POA: Diagnosis not present

## 2020-10-25 DIAGNOSIS — F3341 Major depressive disorder, recurrent, in partial remission: Secondary | ICD-10-CM | POA: Diagnosis not present

## 2020-10-25 DIAGNOSIS — I700 Atherosclerosis of aorta: Secondary | ICD-10-CM | POA: Diagnosis not present

## 2020-10-25 DIAGNOSIS — J449 Chronic obstructive pulmonary disease, unspecified: Secondary | ICD-10-CM | POA: Diagnosis not present

## 2020-10-25 DIAGNOSIS — Z0000 Encounter for general adult medical examination without abnormal findings: Secondary | ICD-10-CM | POA: Diagnosis not present

## 2020-10-31 ENCOUNTER — Telehealth: Payer: Self-pay | Admitting: *Deleted

## 2020-10-31 ENCOUNTER — Encounter: Payer: Self-pay | Admitting: *Deleted

## 2020-10-31 DIAGNOSIS — Z87891 Personal history of nicotine dependence: Secondary | ICD-10-CM

## 2020-10-31 DIAGNOSIS — Z122 Encounter for screening for malignant neoplasm of respiratory organs: Secondary | ICD-10-CM

## 2020-10-31 NOTE — Telephone Encounter (Signed)
Received referral for initial lung cancer screening scan. Contacted patient and obtained smoking history,() as well as answering questions related to screening process. Patient denies signs of lung cancer such as weight loss or hemoptysis. Patient denies comorbidity that would prevent curative treatment if lung cancer were found. Patient is scheduled for shared decision making visit and CT scan on *.

## 2020-10-31 NOTE — Telephone Encounter (Signed)
Received referral for initial lung cancer screening scan. Contacted patient and obtained smoking history,(former smoker, quit in 2015, 1 ppd x 45 yrs) well as answering questions related to screening process. Patient denies signs of lung cancer such as weight loss or hemoptysis. Patient denies comorbidity that would prevent curative treatment if lung cancer were found. Patient is scheduled for shared decision making visit and CT scan on 11/04/20 @ 11:30am.

## 2020-11-04 ENCOUNTER — Encounter: Payer: Self-pay | Admitting: *Deleted

## 2020-11-04 ENCOUNTER — Ambulatory Visit
Admission: RE | Admit: 2020-11-04 | Discharge: 2020-11-04 | Disposition: A | Discharge status: Home / Self Care | Payer: Medicare HMO | Source: Ambulatory Visit | Attending: Oncology | Admitting: Oncology

## 2020-11-04 ENCOUNTER — Other Ambulatory Visit: Payer: Self-pay

## 2020-11-04 DIAGNOSIS — Z87891 Personal history of nicotine dependence: Secondary | ICD-10-CM | POA: Diagnosis not present

## 2020-11-04 DIAGNOSIS — Z122 Encounter for screening for malignant neoplasm of respiratory organs: Secondary | ICD-10-CM | POA: Insufficient documentation

## 2020-11-14 ENCOUNTER — Encounter: Payer: Self-pay | Admitting: *Deleted

## 2020-11-22 DIAGNOSIS — I10 Essential (primary) hypertension: Secondary | ICD-10-CM | POA: Diagnosis not present

## 2020-11-22 DIAGNOSIS — F1020 Alcohol dependence, uncomplicated: Secondary | ICD-10-CM | POA: Diagnosis not present

## 2020-11-22 DIAGNOSIS — E1151 Type 2 diabetes mellitus with diabetic peripheral angiopathy without gangrene: Secondary | ICD-10-CM | POA: Diagnosis not present

## 2020-11-22 DIAGNOSIS — K7010 Alcoholic hepatitis without ascites: Secondary | ICD-10-CM | POA: Diagnosis not present

## 2020-11-22 DIAGNOSIS — J449 Chronic obstructive pulmonary disease, unspecified: Secondary | ICD-10-CM | POA: Diagnosis not present

## 2020-11-22 DIAGNOSIS — I700 Atherosclerosis of aorta: Secondary | ICD-10-CM | POA: Diagnosis not present

## 2020-11-22 DIAGNOSIS — Z9989 Dependence on other enabling machines and devices: Secondary | ICD-10-CM | POA: Diagnosis not present

## 2020-11-22 DIAGNOSIS — F3341 Major depressive disorder, recurrent, in partial remission: Secondary | ICD-10-CM | POA: Diagnosis not present

## 2020-11-22 DIAGNOSIS — I2510 Atherosclerotic heart disease of native coronary artery without angina pectoris: Secondary | ICD-10-CM | POA: Diagnosis not present

## 2021-03-21 DIAGNOSIS — I10 Essential (primary) hypertension: Secondary | ICD-10-CM | POA: Diagnosis not present

## 2021-03-21 DIAGNOSIS — J449 Chronic obstructive pulmonary disease, unspecified: Secondary | ICD-10-CM | POA: Diagnosis not present

## 2021-03-21 DIAGNOSIS — E119 Type 2 diabetes mellitus without complications: Secondary | ICD-10-CM | POA: Diagnosis not present

## 2021-03-28 DIAGNOSIS — I2510 Atherosclerotic heart disease of native coronary artery without angina pectoris: Secondary | ICD-10-CM | POA: Diagnosis not present

## 2021-03-28 DIAGNOSIS — I739 Peripheral vascular disease, unspecified: Secondary | ICD-10-CM | POA: Diagnosis not present

## 2021-03-28 DIAGNOSIS — I700 Atherosclerosis of aorta: Secondary | ICD-10-CM | POA: Diagnosis not present

## 2021-03-28 DIAGNOSIS — F1020 Alcohol dependence, uncomplicated: Secondary | ICD-10-CM | POA: Diagnosis not present

## 2021-03-28 DIAGNOSIS — Z23 Encounter for immunization: Secondary | ICD-10-CM | POA: Diagnosis not present

## 2021-03-28 DIAGNOSIS — J449 Chronic obstructive pulmonary disease, unspecified: Secondary | ICD-10-CM | POA: Diagnosis not present

## 2021-03-28 DIAGNOSIS — E119 Type 2 diabetes mellitus without complications: Secondary | ICD-10-CM | POA: Diagnosis not present

## 2021-03-28 DIAGNOSIS — F3341 Major depressive disorder, recurrent, in partial remission: Secondary | ICD-10-CM | POA: Diagnosis not present

## 2021-04-24 ENCOUNTER — Other Ambulatory Visit (INDEPENDENT_AMBULATORY_CARE_PROVIDER_SITE_OTHER): Payer: Self-pay | Admitting: Internal Medicine

## 2021-04-24 DIAGNOSIS — I7090 Unspecified atherosclerosis: Secondary | ICD-10-CM

## 2021-04-26 ENCOUNTER — Other Ambulatory Visit: Payer: Self-pay

## 2021-04-26 ENCOUNTER — Ambulatory Visit (INDEPENDENT_AMBULATORY_CARE_PROVIDER_SITE_OTHER): Payer: Medicare HMO

## 2021-04-26 ENCOUNTER — Ambulatory Visit (INDEPENDENT_AMBULATORY_CARE_PROVIDER_SITE_OTHER): Payer: Medicare HMO | Admitting: Nurse Practitioner

## 2021-04-26 VITALS — BP 160/75 | HR 93 | Ht 66.0 in | Wt 183.0 lb

## 2021-04-26 DIAGNOSIS — I10 Essential (primary) hypertension: Secondary | ICD-10-CM

## 2021-04-26 DIAGNOSIS — I70219 Atherosclerosis of native arteries of extremities with intermittent claudication, unspecified extremity: Secondary | ICD-10-CM

## 2021-04-26 DIAGNOSIS — E119 Type 2 diabetes mellitus without complications: Secondary | ICD-10-CM | POA: Diagnosis not present

## 2021-04-26 DIAGNOSIS — E7800 Pure hypercholesterolemia, unspecified: Secondary | ICD-10-CM

## 2021-04-26 DIAGNOSIS — I7090 Unspecified atherosclerosis: Secondary | ICD-10-CM

## 2021-05-01 ENCOUNTER — Encounter (INDEPENDENT_AMBULATORY_CARE_PROVIDER_SITE_OTHER): Payer: Self-pay | Admitting: Nurse Practitioner

## 2021-05-01 NOTE — Progress Notes (Signed)
Subjective:    Patient ID: Jack Henson, male    DOB: 04/01/1951, 70 y.o.   MRN: [id number] Chief Complaint  Patient presents with  . New Patient (Initial Visit)    NP anderson PVD . ABI    Jack Henson is a 70 year old male that presents today for evaluation of peripheral arterial disease.  The patient previously had a study done in 2018 however these were not evident with significant peripheral arterial disease.  There was mild evidence of it.  Today however the patient notes worsening claudication-like symptoms.  He does not describe these as lifestyle limiting as of yet.  Denies any rest pain like symptoms or open wounds or ulcerations.  He also notes swelling in his right ankle however he has previously had a heel fusion and multiple fractures in that leg as well.  The patient has a history of neuropathy and describes numbness in his feet.  Today noninvasive studies show a right ABI of 1.02 with a TBI 0.67.  The left ABI 0.76 with a TBI of 0.39.  Previous studies done in 2018 show an ABI of 1.02 on the right and 0.98 on the left.  Today the right lower extremity has biphasic/triphasic waveforms with slightly dampened toe waveforms.  The left lower extremity shows biphasic/monophasic waveforms also with dampened toe waveforms.  Review of Systems  Cardiovascular:        Claudication  Musculoskeletal:  Positive for gait problem and joint swelling.  All other systems reviewed and are negative.     Objective:   Physical Exam Vitals reviewed.  HENT:     Head: Normocephalic.  Cardiovascular:     Rate and Rhythm: Normal rate.     Pulses:          Dorsalis pedis pulses are 1+ on the right side and detected w/ Doppler on the left side.       Posterior tibial pulses are 1+ on the right side and detected w/ Doppler on the left side.  Pulmonary:     Effort: Pulmonary effort is normal.  Musculoskeletal:     Right lower leg: Edema present.  Neurological:     Mental Status: He is alert  and oriented to person, place, and time.  Psychiatric:        Mood and Affect: Mood normal.        Behavior: Behavior normal.        Thought Content: Thought content normal.        Judgment: Judgment normal.    BP (!) 160/75   Pulse 93   Ht 5\' 6"  (1.676 m)   Wt 183 lb (83 kg)   BMI 29.54 kg/m   Past Medical History:  Diagnosis Date  . Arthritis   . COPD (chronic obstructive pulmonary disease) ([place])   . Diabetes mellitus without complication ([place])   . Hyperlipidemia   . Hypertension     Social History   Socioeconomic History  . Marital status: Widowed    Spouse name: Not on file  . Number of children: Not on file  . Years of education: Not on file  . Highest education level: Not on file  Occupational History  . Not on file  Tobacco Use  . Smoking status: Former    Packs/day: 1.00    Years: 45.00    Pack years: 45.00    Types: Cigarettes    Quit date: 2015    Years since quitting: 7.8  . Smokeless tobacco: Never  Vaping Use  . Vaping Use: Never used  Substance and Sexual Activity  . Alcohol use: Yes    Comment: 2 cases of beer weekly, 1/2 gallon vodka weekly  . Drug use: Yes    Types: Marijuana    Comment: everyday  . Sexual activity: Not on file  Other Topics Concern  . Not on file  Social History Narrative  . Not on file   Social Determinants of Health   Financial Resource Strain: Not on file  Food Insecurity: Not on file  Transportation Needs: Not on file  Physical Activity: Not on file  Stress: Not on file  Social Connections: Not on file  Intimate Partner Violence: Not on file    Past Surgical History:  Procedure Laterality Date  . ANKLE SURGERY Right   . CORONARY STENT INTERVENTION N/A 08/26/2017   Procedure: CORONARY STENT INTERVENTION;  Surgeon: Yolonda Kida, MD;  Location: [place] [facility];  Service: Cardiovascular;  Laterality: N/A;  . LEFT HEART CATH AND CORONARY ANGIOGRAPHY Left 08/26/2017   Procedure: LEFT HEART CATH AND  CORONARY ANGIOGRAPHY;  Surgeon: Corey Skains, MD;  Location: [place] [facility];  Service: Cardiovascular;  Laterality: Left;  . LEG SURGERY     stent placement  . SHOULDER SURGERY Right     Family History  Problem Relation Age of Onset  . Kidney disease Mother   . Diabetes Father     Allergies  Allergen Reactions  . Aspirin Other (See Comments)    Hepatitis and alcohol abuse    No flowsheet data found.    CMP     Component Value Date/Time   NA 138 07/22/2014 0738   K 4.7 07/22/2014 0738   CL 102 07/22/2014 0738   CO2 32 07/22/2014 0738   GLUCOSE 153 (H) 07/22/2014 0738   BUN 8 07/22/2014 0738   CREATININE 0.90 07/22/2014 0738   CALCIUM 8.6 07/22/2014 0738   GFRNONAA >60 07/22/2014 0738   GFRAA >60 07/22/2014 0738     No results found.     Assessment & Plan:   1. Atherosclerosis with limb claudication [facility]) The patient does have evidence of PAD causing his claudication.  We discussed the typical progression of PAD which typically includes claudication that is mild with progression to limiting claudication.  We also discussed rest pain as well as ischemic pain.  As we do not discussed the development of wounds or ulcerations.  Currently the patient only has minimal claudication.  Based on his noninvasive studies we discussed an angiogram however at this time the patient wishes to proceed with conservative therapies.  He is advised to ambulate frequently as well as to maintain good appearance of his blood glucose levels, blood pressure and cholesterol.  We will have the patient return in 6 months with noninvasive studies or sooner if symptoms worsen.  2. Type 2 diabetes mellitus without complication, without long-term current use of insulin (HCC) Continue hypoglycemic medications as already ordered, these medications have been reviewed and there are no changes at this time.  Hgb A1C to be monitored as already arranged by primary service   3. Essential  hypertension, benign Continue antihypertensive medications as already ordered, these medications have been reviewed and there are no changes at this time.   4. Hypercholesterolemia Continue statin as ordered and reviewed, no changes at this time    Current Outpatient Medications on File Prior to Visit  Medication Sig Dispense Refill  . amLODipine (NORVASC) 5 MG tablet     .  aspirin EC 81 MG tablet Take 81 mg by mouth daily.    Marland Kitchen atorvastatin (LIPITOR) 10 MG tablet Take 10 mg by mouth daily.    Marland Kitchen atorvastatin (LIPITOR) 20 MG tablet Take 1 tablet by mouth daily.    . fluticasone-salmeterol (ADVAIR) 100-50 MCG/ACT AEPB     . furosemide (LASIX) 40 MG tablet     . losartan (COZAAR) 100 MG tablet TAKE 1 TABLET ONE TIME DAILY    . losartan (COZAAR) 100 MG tablet Take 1 tablet by mouth daily.    . potassium chloride (KLOR-CON) 10 MEQ tablet     . sertraline (ZOLOFT) 100 MG tablet     . sertraline (ZOLOFT) 50 MG tablet Take 50 mg by mouth daily.     Marland Kitchen torsemide (DEMADEX) 20 MG tablet Take by mouth.    . triamcinolone (KENALOG) 0.025 % cream     . amLODipine (NORVASC) 10 MG tablet Take 10 mg by mouth daily.     . ticagrelor (BRILINTA) 90 MG TABS tablet Take 1 tablet (90 mg total) by mouth 2 (two) times daily. 60 tablet 4   No current facility-administered medications on file prior to visit.    There are no Patient Instructions on file for this visit. No follow-ups on file.   Kris Hartmann, NP

## 2021-06-08 DIAGNOSIS — L570 Actinic keratosis: Secondary | ICD-10-CM | POA: Diagnosis not present

## 2021-06-08 DIAGNOSIS — I781 Nevus, non-neoplastic: Secondary | ICD-10-CM | POA: Diagnosis not present

## 2021-06-08 DIAGNOSIS — L578 Other skin changes due to chronic exposure to nonionizing radiation: Secondary | ICD-10-CM | POA: Diagnosis not present

## 2021-06-08 DIAGNOSIS — L918 Other hypertrophic disorders of the skin: Secondary | ICD-10-CM | POA: Diagnosis not present

## 2021-07-25 DIAGNOSIS — E119 Type 2 diabetes mellitus without complications: Secondary | ICD-10-CM | POA: Diagnosis not present

## 2021-07-25 DIAGNOSIS — I2510 Atherosclerotic heart disease of native coronary artery without angina pectoris: Secondary | ICD-10-CM | POA: Diagnosis not present

## 2021-08-01 DIAGNOSIS — F1020 Alcohol dependence, uncomplicated: Secondary | ICD-10-CM | POA: Diagnosis not present

## 2021-08-01 DIAGNOSIS — F3341 Major depressive disorder, recurrent, in partial remission: Secondary | ICD-10-CM | POA: Diagnosis not present

## 2021-08-01 DIAGNOSIS — I10 Essential (primary) hypertension: Secondary | ICD-10-CM | POA: Diagnosis not present

## 2021-08-01 DIAGNOSIS — E119 Type 2 diabetes mellitus without complications: Secondary | ICD-10-CM | POA: Diagnosis not present

## 2021-08-01 DIAGNOSIS — I700 Atherosclerosis of aorta: Secondary | ICD-10-CM | POA: Diagnosis not present

## 2021-08-01 DIAGNOSIS — J449 Chronic obstructive pulmonary disease, unspecified: Secondary | ICD-10-CM | POA: Diagnosis not present

## 2021-08-01 DIAGNOSIS — E1151 Type 2 diabetes mellitus with diabetic peripheral angiopathy without gangrene: Secondary | ICD-10-CM | POA: Diagnosis not present

## 2021-08-01 DIAGNOSIS — I2510 Atherosclerotic heart disease of native coronary artery without angina pectoris: Secondary | ICD-10-CM | POA: Diagnosis not present

## 2021-08-29 DIAGNOSIS — E119 Type 2 diabetes mellitus without complications: Secondary | ICD-10-CM | POA: Diagnosis not present

## 2021-08-31 IMAGING — CT CT CHEST LUNG CANCER SCREENING LOW DOSE W/O CM
1 series · 10 of 10 positions shown, 13 images · non-contrast
Comparison: 07/24/2017 chest CT angiogram.

CLINICAL DATA: 69-year-old asymptomatic male former smoker with 45
pack-year smoking history, quit smoking 7 years prior.

EXAM:
CT CHEST WITHOUT CONTRAST LOW-DOSE FOR LUNG CANCER SCREENING
TECHNIQUE: Multidetector CT imaging of the chest was performed following the
standard protocol without IV contrast.

[ct lung segmentation data · axial · 0.72mm/px · z∈[-265,-265]mm · 10 of 297 frames shown]
[frame 1/297  mediastinal]
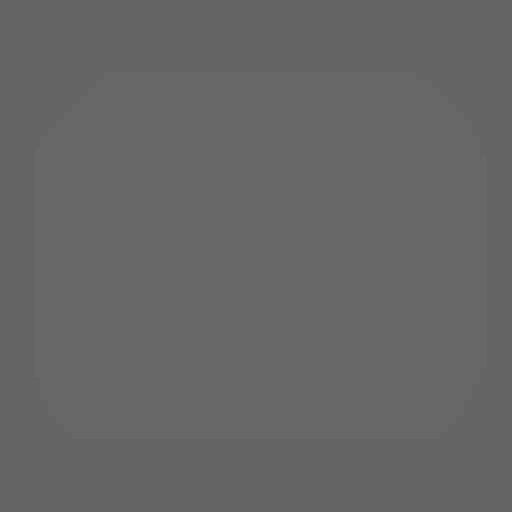
[frame 1/297  lung]
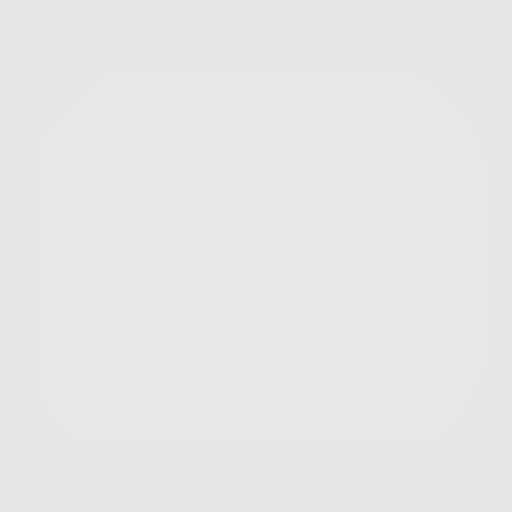
[frame 33/297  lung]
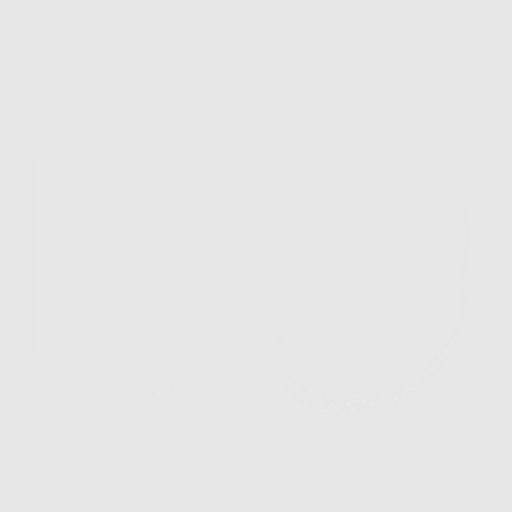
[frame 66/297  lung]
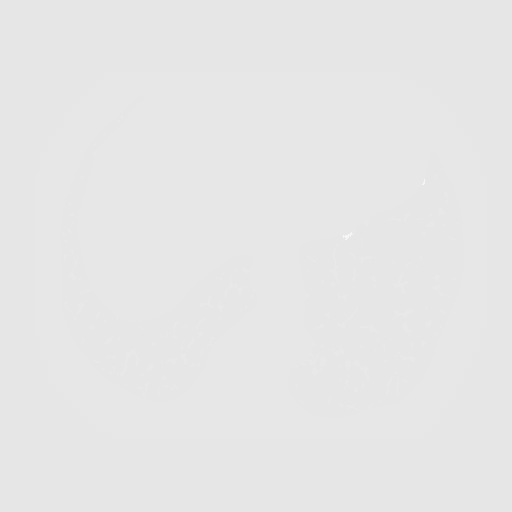
[frame 99/297  lung]
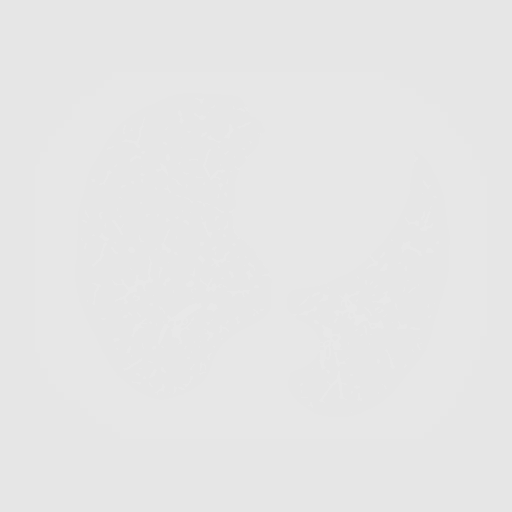
[frame 132/297  mediastinal]
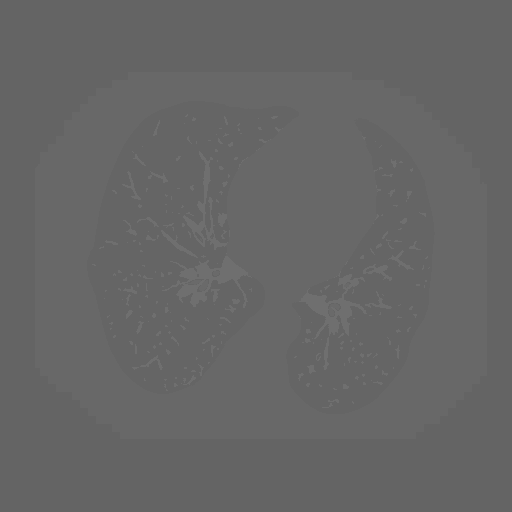
[frame 132/297  lung]
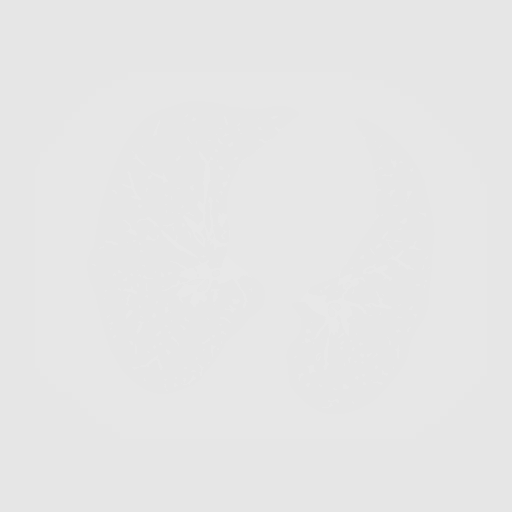
[frame 165/297  lung]
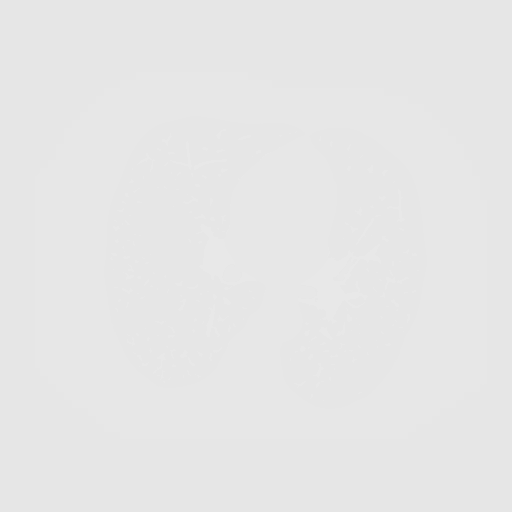
[frame 198/297  lung]
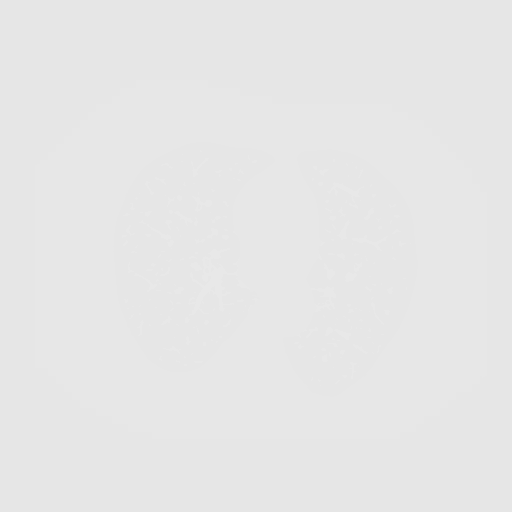
[frame 231/297  lung]
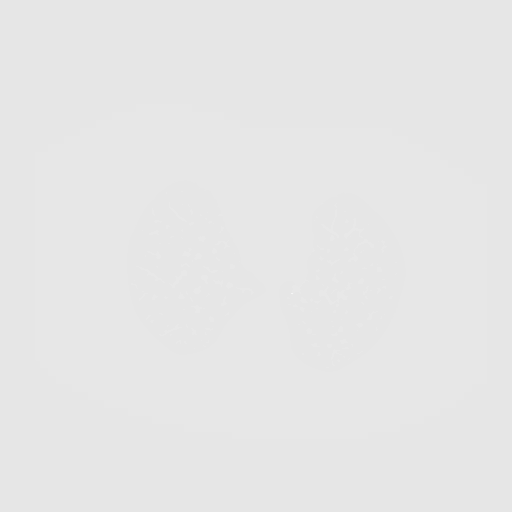
[frame 264/297  mediastinal]
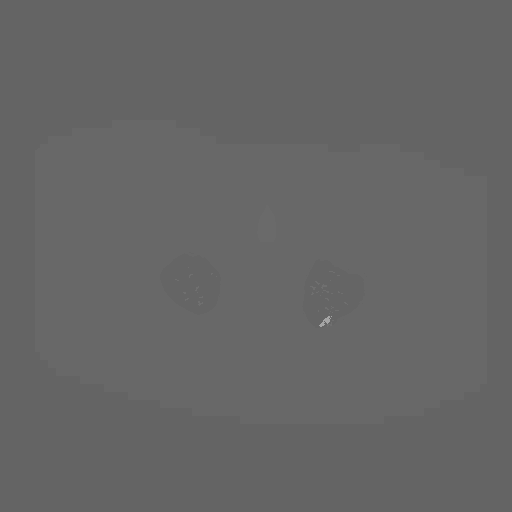
[frame 264/297  lung]
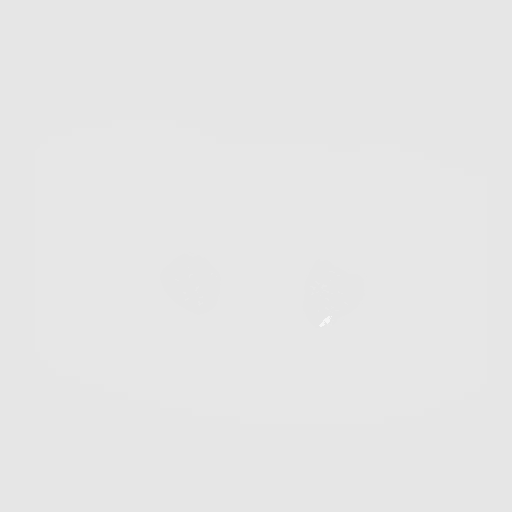
[frame 297/297  lung]
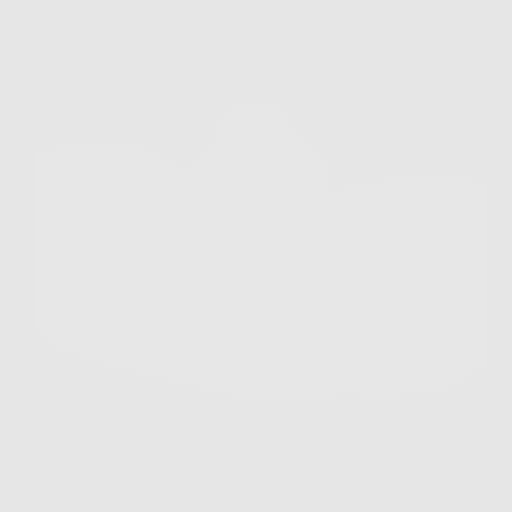

[10 of 10 positions shown; findings below may reference images not displayed]

FINDINGS: Cardiovascular: Normal heart size. No significant pericardial
effusion/thickening. Three-vessel coronary atherosclerosis.
Atherosclerotic nonaneurysmal thoracic aorta. Normal caliber
pulmonary arteries.

Mediastinum/Nodes: No discrete thyroid nodules. Unremarkable
esophagus. No pathologically enlarged axillary, mediastinal or hilar
lymph nodes, noting limited sensitivity for the detection of hilar
adenopathy on this noncontrast study.

Lungs/Pleura: No pneumothorax. No pleural effusion. Mild
centrilobular emphysema with mild diffuse bronchial wall thickening.
No acute consolidative airspace disease or lung masses. No
significant pulmonary nodules.

Upper abdomen: No acute abnormality.

Musculoskeletal: No aggressive appearing focal osseous lesions.
Symmetric mild bilateral gynecomastia is unchanged. Marked
degenerative disc disease at T8-9, similar.
IMPRESSION: 1. Lung-RADS 1, negative. Continue annual screening with low-dose
chest CT without contrast in 12 months.
2. Three-vessel coronary atherosclerosis.
3. Aortic Atherosclerosis (DMG44-U5C.C) and Emphysema (DMG44-V5M.V).

## 2021-10-23 ENCOUNTER — Other Ambulatory Visit (INDEPENDENT_AMBULATORY_CARE_PROVIDER_SITE_OTHER): Payer: Self-pay | Admitting: Nurse Practitioner

## 2021-10-23 DIAGNOSIS — I70219 Atherosclerosis of native arteries of extremities with intermittent claudication, unspecified extremity: Secondary | ICD-10-CM

## 2021-10-24 ENCOUNTER — Ambulatory Visit (INDEPENDENT_AMBULATORY_CARE_PROVIDER_SITE_OTHER): Payer: Medicare HMO | Admitting: Vascular Surgery

## 2021-10-24 ENCOUNTER — Ambulatory Visit (INDEPENDENT_AMBULATORY_CARE_PROVIDER_SITE_OTHER): Payer: Medicare HMO

## 2021-10-24 ENCOUNTER — Encounter (INDEPENDENT_AMBULATORY_CARE_PROVIDER_SITE_OTHER): Payer: Self-pay

## 2021-10-24 DIAGNOSIS — I70219 Atherosclerosis of native arteries of extremities with intermittent claudication, unspecified extremity: Secondary | ICD-10-CM

## 2021-10-24 NOTE — Progress Notes (Signed)
Patient left.  Will mail results ?

## 2021-10-25 ENCOUNTER — Encounter (INDEPENDENT_AMBULATORY_CARE_PROVIDER_SITE_OTHER): Payer: Self-pay | Admitting: *Deleted

## 2021-11-09 ENCOUNTER — Telehealth: Payer: Self-pay | Admitting: Acute Care

## 2021-11-09 NOTE — Telephone Encounter (Signed)
Attempted pt to schedule LDCT (annual)-[facility] ?

## 2021-11-24 ENCOUNTER — Encounter (INDEPENDENT_AMBULATORY_CARE_PROVIDER_SITE_OTHER): Payer: Self-pay | Admitting: *Deleted

## 2022-01-30 DIAGNOSIS — E119 Type 2 diabetes mellitus without complications: Secondary | ICD-10-CM | POA: Diagnosis not present

## 2022-01-30 DIAGNOSIS — I10 Essential (primary) hypertension: Secondary | ICD-10-CM | POA: Diagnosis not present

## 2022-01-30 DIAGNOSIS — J449 Chronic obstructive pulmonary disease, unspecified: Secondary | ICD-10-CM | POA: Diagnosis not present

## 2022-01-30 DIAGNOSIS — Z125 Encounter for screening for malignant neoplasm of prostate: Secondary | ICD-10-CM | POA: Diagnosis not present

## 2022-02-06 DIAGNOSIS — Z0000 Encounter for general adult medical examination without abnormal findings: Secondary | ICD-10-CM | POA: Diagnosis not present

## 2022-02-06 DIAGNOSIS — Z1389 Encounter for screening for other disorder: Secondary | ICD-10-CM | POA: Diagnosis not present

## 2022-02-06 DIAGNOSIS — I700 Atherosclerosis of aorta: Secondary | ICD-10-CM | POA: Diagnosis not present

## 2022-02-06 DIAGNOSIS — E119 Type 2 diabetes mellitus without complications: Secondary | ICD-10-CM | POA: Diagnosis not present

## 2022-02-06 DIAGNOSIS — I2510 Atherosclerotic heart disease of native coronary artery without angina pectoris: Secondary | ICD-10-CM | POA: Diagnosis not present

## 2022-02-06 DIAGNOSIS — F3341 Major depressive disorder, recurrent, in partial remission: Secondary | ICD-10-CM | POA: Diagnosis not present

## 2022-02-06 DIAGNOSIS — I10 Essential (primary) hypertension: Secondary | ICD-10-CM | POA: Diagnosis not present

## 2022-02-06 DIAGNOSIS — J449 Chronic obstructive pulmonary disease, unspecified: Secondary | ICD-10-CM | POA: Diagnosis not present

## 2022-02-06 DIAGNOSIS — F1020 Alcohol dependence, uncomplicated: Secondary | ICD-10-CM | POA: Diagnosis not present

## 2022-02-23 ENCOUNTER — Other Ambulatory Visit: Payer: Self-pay

## 2022-04-24 ENCOUNTER — Ambulatory Visit (INDEPENDENT_AMBULATORY_CARE_PROVIDER_SITE_OTHER): Payer: Medicare HMO | Admitting: Nurse Practitioner

## 2022-04-24 ENCOUNTER — Encounter (INDEPENDENT_AMBULATORY_CARE_PROVIDER_SITE_OTHER): Payer: Medicare HMO

## 2022-05-07 ENCOUNTER — Encounter (INDEPENDENT_AMBULATORY_CARE_PROVIDER_SITE_OTHER): Payer: Self-pay

## 2022-05-15 DIAGNOSIS — I2510 Atherosclerotic heart disease of native coronary artery without angina pectoris: Secondary | ICD-10-CM | POA: Diagnosis not present

## 2022-05-15 DIAGNOSIS — F3341 Major depressive disorder, recurrent, in partial remission: Secondary | ICD-10-CM | POA: Diagnosis not present

## 2022-05-15 DIAGNOSIS — Z87891 Personal history of nicotine dependence: Secondary | ICD-10-CM | POA: Diagnosis not present

## 2022-05-15 DIAGNOSIS — I10 Essential (primary) hypertension: Secondary | ICD-10-CM | POA: Diagnosis not present

## 2022-05-15 DIAGNOSIS — J449 Chronic obstructive pulmonary disease, unspecified: Secondary | ICD-10-CM | POA: Diagnosis not present

## 2022-05-15 DIAGNOSIS — E7800 Pure hypercholesterolemia, unspecified: Secondary | ICD-10-CM | POA: Diagnosis not present

## 2022-05-15 DIAGNOSIS — Z23 Encounter for immunization: Secondary | ICD-10-CM | POA: Diagnosis not present

## 2022-05-15 DIAGNOSIS — I700 Atherosclerosis of aorta: Secondary | ICD-10-CM | POA: Diagnosis not present

## 2022-05-15 DIAGNOSIS — E1151 Type 2 diabetes mellitus with diabetic peripheral angiopathy without gangrene: Secondary | ICD-10-CM | POA: Diagnosis not present

## 2022-09-04 DIAGNOSIS — I10 Essential (primary) hypertension: Secondary | ICD-10-CM | POA: Diagnosis not present

## 2022-09-04 DIAGNOSIS — E119 Type 2 diabetes mellitus without complications: Secondary | ICD-10-CM | POA: Diagnosis not present

## 2022-09-04 DIAGNOSIS — I2510 Atherosclerotic heart disease of native coronary artery without angina pectoris: Secondary | ICD-10-CM | POA: Diagnosis not present

## 2022-09-11 DIAGNOSIS — E119 Type 2 diabetes mellitus without complications: Secondary | ICD-10-CM | POA: Diagnosis not present

## 2022-09-11 DIAGNOSIS — J4489 Other specified chronic obstructive pulmonary disease: Secondary | ICD-10-CM | POA: Diagnosis not present

## 2022-09-11 DIAGNOSIS — F1020 Alcohol dependence, uncomplicated: Secondary | ICD-10-CM | POA: Diagnosis not present

## 2022-09-11 DIAGNOSIS — I2510 Atherosclerotic heart disease of native coronary artery without angina pectoris: Secondary | ICD-10-CM | POA: Diagnosis not present

## 2022-09-11 DIAGNOSIS — F3341 Major depressive disorder, recurrent, in partial remission: Secondary | ICD-10-CM | POA: Diagnosis not present

## 2022-10-10 ENCOUNTER — Other Ambulatory Visit (INDEPENDENT_AMBULATORY_CARE_PROVIDER_SITE_OTHER): Payer: Self-pay | Admitting: Vascular Surgery

## 2022-10-10 DIAGNOSIS — I739 Peripheral vascular disease, unspecified: Secondary | ICD-10-CM

## 2022-10-23 ENCOUNTER — Ambulatory Visit (INDEPENDENT_AMBULATORY_CARE_PROVIDER_SITE_OTHER): Payer: Medicare HMO

## 2022-10-23 ENCOUNTER — Encounter (INDEPENDENT_AMBULATORY_CARE_PROVIDER_SITE_OTHER): Payer: Self-pay | Admitting: Nurse Practitioner

## 2022-10-23 ENCOUNTER — Ambulatory Visit (INDEPENDENT_AMBULATORY_CARE_PROVIDER_SITE_OTHER): Payer: Medicare HMO | Admitting: Nurse Practitioner

## 2022-10-23 VITALS — BP 116/77 | HR 98 | Resp 16 | Ht 66.0 in | Wt 166.6 lb

## 2022-10-23 DIAGNOSIS — E119 Type 2 diabetes mellitus without complications: Secondary | ICD-10-CM

## 2022-10-23 DIAGNOSIS — Z9889 Other specified postprocedural states: Secondary | ICD-10-CM | POA: Diagnosis not present

## 2022-10-23 DIAGNOSIS — I739 Peripheral vascular disease, unspecified: Secondary | ICD-10-CM | POA: Diagnosis not present

## 2022-10-23 DIAGNOSIS — I10 Essential (primary) hypertension: Secondary | ICD-10-CM

## 2022-10-23 NOTE — Progress Notes (Signed)
Subjective:    Patient ID: Jack Henson, male    DOB: [birth year], 72 y.o.   MRN: [id number] Chief Complaint  Patient presents with   Follow-up    Follow up 14yr abi    Jack Henson is a 72 year old male that returns to the office for followup and review of the noninvasive studies.   He does note that he has been having some issues with his lower extremities but he attributes this to to being related to the knees themselves.  He continues to have pain and issues with his knees with the left being worse than the right.  His previous studies in 2023 indicated that there may be some iliac level disease.  He denies any classic rest pain like symptoms or traditional claudication-like symptoms, especially ones that are situated within the hip or thigh area..  Currently there are no open wounds or ulcerations.  There have been no significant changes to the patient's overall health care.  The patient denies amaurosis fugax or recent TIA symptoms. There are no documented recent neurological changes noted. There is no history of DVT, PE or superficial thrombophlebitis. The patient denies recent episodes of angina or shortness of breath.   ABI Rt=0.99 and Lt=0.88  (previous ABI's Rt=1.01 and Lt=0.77) the patient has normal TBI's noted bilaterally.  He has good toe waveforms noted bilaterally.  The patient has noted biphasic tibial artery waveforms on the left with biphasic/triphasic on the right.     Review of Systems  Musculoskeletal:  Positive for arthralgias and gait problem.  All other systems reviewed and are negative.      Objective:   Physical Exam Vitals reviewed.  HENT:     Head: Normocephalic.  Cardiovascular:     Rate and Rhythm: Normal rate.     Pulses:          Dorsalis pedis pulses are detected w/ Doppler on the right side and detected w/ Doppler on the left side.       Posterior tibial pulses are detected w/ Doppler on the right side and detected w/ Doppler on the left  side.  Pulmonary:     Effort: Pulmonary effort is normal.  Musculoskeletal:     Right lower leg: Edema present.  Skin:    General: Skin is warm and dry.  Neurological:     Mental Status: He is alert and oriented to person, place, and time.  Psychiatric:        Mood and Affect: Mood normal.        Behavior: Behavior normal.        Thought Content: Thought content normal.        Judgment: Judgment normal.     BP 116/77 (BP Location: Right Arm)   Pulse 98   Resp 16   Ht 5\' 6"  (1.676 m)   Wt 166 lb 9.6 oz (75.6 kg)   BMI 26.89 kg/m   Past Medical History:  Diagnosis Date   Arthritis    COPD (chronic obstructive pulmonary disease)    Diabetes mellitus without complication    Hyperlipidemia    Hypertension     Social History   Socioeconomic History   Marital status: Widowed    Spouse name: Not on file   Number of children: Not on file   Years of education: Not on file   Highest education level: Not on file  Occupational History   Not on file  Tobacco Use   Smoking status: Former  Packs/day: 1.00    Years: 45.00    Additional pack years: 0.00    Total pack years: 45.00    Types: Cigarettes    Quit date: 2015    Years since quitting: 9.2   Smokeless tobacco: Never  Vaping Use   Vaping Use: Never used  Substance and Sexual Activity   Alcohol use: Yes    Comment: 2 cases of beer weekly, 1/2 gallon vodka weekly   Drug use: Yes    Types: Marijuana    Comment: everyday   Sexual activity: Not on file  Other Topics Concern   Not on file  Social History Narrative   Not on file   Social Determinants of Health   Financial Resource Strain: Not on file  Food Insecurity: Not on file  Transportation Needs: Not on file  Physical Activity: Not on file  Stress: Not on file  Social Connections: Not on file  Intimate Partner Violence: Not on file    Past Surgical History:  Procedure Laterality Date   ANKLE SURGERY Right    CORONARY STENT INTERVENTION N/A  08/26/2017   Procedure: CORONARY STENT INTERVENTION;  Surgeon: Alwyn Pea, MD;  Location: [facility];  Service: Cardiovascular;  Laterality: N/A;   LEFT HEART CATH AND CORONARY ANGIOGRAPHY Left 08/26/2017   Procedure: LEFT HEART CATH AND CORONARY ANGIOGRAPHY;  Surgeon: Lamar Blinks, MD;  Location: [facility];  Service: Cardiovascular;  Laterality: Left;   LEG SURGERY     stent placement   SHOULDER SURGERY Right     Family History  Problem Relation Age of Onset   Kidney disease Mother    Diabetes Father     Allergies  Allergen Reactions   Aspirin Other (See Comments)    Hepatitis and alcohol abuse        No data to display            CMP     Component Value Date/Time   NA 138 07/22/2014 0738   K 4.7 07/22/2014 0738   CL 102 07/22/2014 0738   CO2 32 07/22/2014 0738   GLUCOSE 153 (H) 07/22/2014 0738   BUN 8 07/22/2014 0738   CREATININE 0.90 07/22/2014 0738   CALCIUM 8.6 07/22/2014 0738   GFRNONAA >60 07/22/2014 0738   GFRAA >60 07/22/2014 0738     No results found.     Assessment & Plan:   1. Peripheral arterial disease with history of revascularization  Recommend:  The patient has evidence of atherosclerosis of the lower extremities with claudication.  The patient does not voice lifestyle limiting changes at this point in time.  Noninvasive studies do not suggest clinically significant change.  No invasive studies, angiography or surgery at this time The patient should continue walking and begin a more formal exercise program.  The patient should continue antiplatelet therapy and aggressive treatment of the lipid abnormalities  No changes in the patient's medications at this time  Continued surveillance is indicated as atherosclerosis is likely to progress with time.    The patient will continue follow up with noninvasive studies as ordered.   2. Type 2 diabetes mellitus without complication, without long-term current  use of insulin Continue hypoglycemic medications as already ordered, these medications have been reviewed and there are no changes at this time.  Hgb A1C to be monitored as already arranged by primary service  3. Essential hypertension, benign Continue antihypertensive medications as already ordered, these medications have been reviewed and there  are no changes at this time.   Current Outpatient Medications on File Prior to Visit  Medication Sig Dispense Refill   amLODipine (NORVASC) 5 MG tablet      atorvastatin (LIPITOR) 20 MG tablet Take 1 tablet by mouth daily.     busPIRone (BUSPAR) 15 MG tablet Take 15 mg by mouth 2 (two) times daily.     fluticasone-salmeterol (ADVAIR) 100-50 MCG/ACT AEPB      furosemide (LASIX) 40 MG tablet      losartan (COZAAR) 100 MG tablet TAKE 1 TABLET ONE TIME DAILY     losartan (COZAAR) 100 MG tablet Take 1 tablet by mouth daily.     naltrexone (DEPADE) 50 MG tablet Take 50 mg by mouth daily.     potassium chloride (KLOR-CON) 10 MEQ tablet      sertraline (ZOLOFT) 100 MG tablet      amLODipine (NORVASC) 10 MG tablet Take 10 mg by mouth daily.      aspirin EC 81 MG tablet Take 81 mg by mouth daily.     sertraline (ZOLOFT) 50 MG tablet Take 50 mg by mouth daily.      ticagrelor (BRILINTA) 90 MG TABS tablet Take 1 tablet (90 mg total) by mouth 2 (two) times daily. 60 tablet 4   torsemide (DEMADEX) 20 MG tablet Take by mouth. (Patient not taking: Reported on 10/23/2022)     triamcinolone (KENALOG) 0.025 % cream  (Patient not taking: Reported on 10/23/2022)     No current facility-administered medications on file prior to visit.    There are no Patient Instructions on file for this visit. No follow-ups on file.   Georgiana Spinner, NP

## 2022-10-24 LAB — VAS US ABI WITH/WO TBI
Left ABI: 0.88
Right ABI: 0.99

## 2023-02-05 DIAGNOSIS — E119 Type 2 diabetes mellitus without complications: Secondary | ICD-10-CM | POA: Diagnosis not present

## 2023-02-05 DIAGNOSIS — I2510 Atherosclerotic heart disease of native coronary artery without angina pectoris: Secondary | ICD-10-CM | POA: Diagnosis not present

## 2023-02-05 DIAGNOSIS — F3341 Major depressive disorder, recurrent, in partial remission: Secondary | ICD-10-CM | POA: Diagnosis not present

## 2023-02-05 DIAGNOSIS — Z125 Encounter for screening for malignant neoplasm of prostate: Secondary | ICD-10-CM | POA: Diagnosis not present

## 2023-02-12 DIAGNOSIS — I2510 Atherosclerotic heart disease of native coronary artery without angina pectoris: Secondary | ICD-10-CM | POA: Diagnosis not present

## 2023-02-12 DIAGNOSIS — E7800 Pure hypercholesterolemia, unspecified: Secondary | ICD-10-CM | POA: Diagnosis not present

## 2023-02-12 DIAGNOSIS — I700 Atherosclerosis of aorta: Secondary | ICD-10-CM | POA: Diagnosis not present

## 2023-02-12 DIAGNOSIS — E1122 Type 2 diabetes mellitus with diabetic chronic kidney disease: Secondary | ICD-10-CM | POA: Diagnosis not present

## 2023-02-12 DIAGNOSIS — F1020 Alcohol dependence, uncomplicated: Secondary | ICD-10-CM | POA: Diagnosis not present

## 2023-02-12 DIAGNOSIS — K7010 Alcoholic hepatitis without ascites: Secondary | ICD-10-CM | POA: Diagnosis not present

## 2023-02-12 DIAGNOSIS — Z0000 Encounter for general adult medical examination without abnormal findings: Secondary | ICD-10-CM | POA: Diagnosis not present

## 2023-02-12 DIAGNOSIS — J4489 Other specified chronic obstructive pulmonary disease: Secondary | ICD-10-CM | POA: Diagnosis not present

## 2023-02-12 DIAGNOSIS — Z1331 Encounter for screening for depression: Secondary | ICD-10-CM | POA: Diagnosis not present

## 2023-03-14 DIAGNOSIS — M069 Rheumatoid arthritis, unspecified: Secondary | ICD-10-CM | POA: Diagnosis not present

## 2023-03-14 DIAGNOSIS — E119 Type 2 diabetes mellitus without complications: Secondary | ICD-10-CM | POA: Diagnosis not present

## 2023-08-09 DIAGNOSIS — E1122 Type 2 diabetes mellitus with diabetic chronic kidney disease: Secondary | ICD-10-CM | POA: Diagnosis not present

## 2023-08-09 DIAGNOSIS — N1831 Chronic kidney disease, stage 3a: Secondary | ICD-10-CM | POA: Diagnosis not present

## 2023-08-09 DIAGNOSIS — I700 Atherosclerosis of aorta: Secondary | ICD-10-CM | POA: Diagnosis not present

## 2023-08-09 DIAGNOSIS — I2510 Atherosclerotic heart disease of native coronary artery without angina pectoris: Secondary | ICD-10-CM | POA: Diagnosis not present

## 2023-08-23 DIAGNOSIS — E1122 Type 2 diabetes mellitus with diabetic chronic kidney disease: Secondary | ICD-10-CM | POA: Diagnosis not present

## 2023-08-23 DIAGNOSIS — I2510 Atherosclerotic heart disease of native coronary artery without angina pectoris: Secondary | ICD-10-CM | POA: Diagnosis not present

## 2023-08-23 DIAGNOSIS — F3341 Major depressive disorder, recurrent, in partial remission: Secondary | ICD-10-CM | POA: Diagnosis not present

## 2023-08-23 DIAGNOSIS — K7010 Alcoholic hepatitis without ascites: Secondary | ICD-10-CM | POA: Diagnosis not present

## 2023-08-23 DIAGNOSIS — N1831 Chronic kidney disease, stage 3a: Secondary | ICD-10-CM | POA: Diagnosis not present

## 2023-08-23 DIAGNOSIS — J4489 Other specified chronic obstructive pulmonary disease: Secondary | ICD-10-CM | POA: Diagnosis not present

## 2023-08-23 DIAGNOSIS — E7800 Pure hypercholesterolemia, unspecified: Secondary | ICD-10-CM | POA: Diagnosis not present

## 2023-09-04 DIAGNOSIS — L02212 Cutaneous abscess of back [any part, except buttock]: Secondary | ICD-10-CM | POA: Diagnosis not present

## 2023-10-29 ENCOUNTER — Ambulatory Visit (INDEPENDENT_AMBULATORY_CARE_PROVIDER_SITE_OTHER): Payer: Medicare HMO | Admitting: Vascular Surgery

## 2023-10-29 ENCOUNTER — Encounter (INDEPENDENT_AMBULATORY_CARE_PROVIDER_SITE_OTHER): Payer: Medicare HMO

## 2023-11-04 ENCOUNTER — Other Ambulatory Visit (INDEPENDENT_AMBULATORY_CARE_PROVIDER_SITE_OTHER): Payer: Self-pay | Admitting: Nurse Practitioner

## 2023-11-04 DIAGNOSIS — I739 Peripheral vascular disease, unspecified: Secondary | ICD-10-CM

## 2023-11-08 ENCOUNTER — Ambulatory Visit (INDEPENDENT_AMBULATORY_CARE_PROVIDER_SITE_OTHER): Admitting: Nurse Practitioner

## 2023-11-08 ENCOUNTER — Ambulatory Visit (INDEPENDENT_AMBULATORY_CARE_PROVIDER_SITE_OTHER)

## 2023-11-08 VITALS — Resp 17

## 2023-11-08 DIAGNOSIS — I739 Peripheral vascular disease, unspecified: Secondary | ICD-10-CM

## 2023-11-08 DIAGNOSIS — I10 Essential (primary) hypertension: Secondary | ICD-10-CM

## 2023-11-08 DIAGNOSIS — M7989 Other specified soft tissue disorders: Secondary | ICD-10-CM | POA: Diagnosis not present

## 2023-11-08 DIAGNOSIS — M170 Bilateral primary osteoarthritis of knee: Secondary | ICD-10-CM

## 2023-11-08 DIAGNOSIS — E119 Type 2 diabetes mellitus without complications: Secondary | ICD-10-CM

## 2023-11-08 DIAGNOSIS — Z9889 Other specified postprocedural states: Secondary | ICD-10-CM

## 2023-11-11 ENCOUNTER — Encounter (INDEPENDENT_AMBULATORY_CARE_PROVIDER_SITE_OTHER): Payer: Self-pay | Admitting: Nurse Practitioner

## 2023-11-11 NOTE — Progress Notes (Signed)
 Subjective:    Patient ID: Jack Henson, male    DOB: Dec 27, 1950, 73 y.o.   MRN: [id number] Chief Complaint  Patient presents with   Venous Insufficiency    Jack Henson is a 73 year old male that returns to the office for followup and review of the noninvasive studies.   He does note that he has been having some issues with his lower extremities but he attributes this to to being related to the knees themselves.  He continues to have pain and issues with his knees with the left being worse than the right.  His previous studies in 2023 indicated that there may be some iliac level disease.  He denies any classic rest pain like symptoms or traditional claudication-like symptoms, especially ones that are situated within the hip or thigh area..  Currently there are no open wounds or ulcerations.  There have been no significant changes to the patient's overall health care.  The patient denies amaurosis fugax or recent TIA symptoms. There are no documented recent neurological changes noted. There is no history of DVT, PE or superficial thrombophlebitis. The patient denies recent episodes of angina or shortness of breath.   ABI Rt=1.07 and Lt=1.03  (previous ABI's Rt=0.99 and Lt=0.88) the patient has normal TBI's noted bilaterally.  He has good toe waveforms noted bilaterally.  The patient has triphasic tibial artery waveforms on the right with multiphasic in the left.    Review of Systems  Musculoskeletal:  Positive for arthralgias and gait problem.  All other systems reviewed and are negative.      Objective:   Physical Exam Vitals reviewed.  HENT:     Head: Normocephalic.  Cardiovascular:     Rate and Rhythm: Normal rate.     Pulses:          Dorsalis pedis pulses are detected w/ Doppler on the right side and detected w/ Doppler on the left side.       Posterior tibial pulses are detected w/ Doppler on the right side and detected w/ Doppler on the left side.  Pulmonary:      Effort: Pulmonary effort is normal.  Musculoskeletal:     Right lower leg: Edema present.  Skin:    General: Skin is warm and dry.  Neurological:     Mental Status: He is alert and oriented to person, place, and time.  Psychiatric:        Mood and Affect: Mood normal.        Behavior: Behavior normal.        Thought Content: Thought content normal.        Judgment: Judgment normal.     Resp 17   Past Medical History:  Diagnosis Date   Arthritis    COPD (chronic obstructive pulmonary disease) (HCC)    Diabetes mellitus without complication (HCC)    Hyperlipidemia    Hypertension     Social History   Socioeconomic History   Marital status: Widowed    Spouse name: Not on file   Number of children: Not on file   Years of education: Not on file   Highest education level: Not on file  Occupational History   Not on file  Tobacco Use   Smoking status: Former    Current packs/day: 0.00    Average packs/day: 1 pack/day for 45.0 years (45.0 ttl pk-yrs)    Types: Cigarettes    Start date: 27    Quit date: 2015    Years since  quitting: 10.3   Smokeless tobacco: Never  Vaping Use   Vaping status: Never Used  Substance and Sexual Activity   Alcohol use: Yes    Comment: 2 cases of beer weekly, 1/2 gallon vodka weekly   Drug use: Yes    Types: Marijuana    Comment: everyday   Sexual activity: Not on file  Other Topics Concern   Not on file  Social History Narrative   Not on file   Social Drivers of Health   Financial Resource Strain: Low Risk  (02/12/2023)   Received from [facility]   Overall Financial Resource Strain (CARDIA)    Difficulty of Paying Living Expenses: Not hard at all  Food Insecurity: No Food Insecurity (02/12/2023)   Received from [facility]   Hunger Vital Sign    Worried About Running Out of Food in the Last Year: Never true    Ran Out of Food in the Last Year: Never true  Transportation Needs: No  Transportation Needs (02/12/2023)   Received from [facility]   PRAPARE - Transportation    Lack of Transportation (Non-Medical): No    In the past 12 months, has lack of transportation kept you from medical appointments or from getting medications?: No  Physical Activity: Not on file  Stress: Not on file  Social Connections: Not on file  Intimate Partner Violence: Not on file    Past Surgical History:  Procedure Laterality Date   ANKLE SURGERY Right    CORONARY STENT INTERVENTION N/A 08/26/2017   Procedure: CORONARY STENT INTERVENTION;  Surgeon: Antonette Batters, MD;  Location: [facility] INVASIVE CV LAB;  Service: Cardiovascular;  Laterality: N/A;   LEFT HEART CATH AND CORONARY ANGIOGRAPHY Left 08/26/2017   Procedure: LEFT HEART CATH AND CORONARY ANGIOGRAPHY;  Surgeon: Michelle Aid, MD;  Location: [facility] INVASIVE CV LAB;  Service: Cardiovascular;  Laterality: Left;   LEG SURGERY     stent placement   SHOULDER SURGERY Right     Family History  Problem Relation Age of Onset   Kidney disease Mother    Diabetes Father     Allergies  Allergen Reactions   Aspirin  Other (See Comments)    Hepatitis and alcohol abuse        No data to display            CMP     Component Value Date/Time   NA 138 07/22/2014 0738   K 4.7 07/22/2014 0738   CL 102 07/22/2014 0738   CO2 32 07/22/2014 0738   GLUCOSE 153 (H) 07/22/2014 0738   BUN 8 07/22/2014 0738   CREATININE 0.90 07/22/2014 0738   CALCIUM  8.6 07/22/2014 0738   GFRNONAA >60 07/22/2014 0738   GFRAA >60 07/22/2014 0738     No results found.     Assessment & Plan:   1. Peripheral arterial disease with history of revascularization  Recommend:  The patient has evidence of atherosclerosis of the lower extremities with claudication.  The patient does not voice lifestyle limiting changes at this point in time.  Noninvasive studies do not suggest clinically significant change.  No invasive studies,  angiography or surgery at this time The patient should continue walking and begin a more formal exercise program.  The patient should continue antiplatelet therapy and aggressive treatment of the lipid abnormalities  No changes in the patient's medications at this time  Continued surveillance is indicated as atherosclerosis is likely to progress with time.  The patient will continue follow up with noninvasive studies as ordered.   2. Type 2 diabetes mellitus without complication, without long-term current use of insulin Continue hypoglycemic medications as already ordered, these medications have been reviewed and there are no changes at this time.  Hgb A1C to be monitored as already arranged by primary service  3. Essential hypertension, benign Continue antihypertensive medications as already ordered, these medications have been reviewed and there are no changes at this time.   4. Osteoarthritis of both knees, unspecified osteoarthritis type The patient has significant pain in his legs with osteoarthritis.  He has previously had injections which were helpful for him.  Will refer patient back to orthopedic surgery for evaluation.  5. Leg swelling Patient does have some lower extremity edema with the right being worse.  He has had a fracture in 3 different places which is likely the cause and it is more traumatic than anything.  He is advised to continue with use of medical grade compression stockings daily.  He should also elevate his lower extremities as possible.   Current Outpatient Medications on File Prior to Visit  Medication Sig Dispense Refill   albuterol (VENTOLIN HFA) 108 (90 Base) MCG/ACT inhaler Inhale into the lungs.     amLODipine  (NORVASC ) 5 MG tablet      Aspirin -Caffeine (BC FAST PAIN RELIEF PO) Take 1 each by mouth.     atorvastatin  (LIPITOR) 20 MG tablet Take 1 tablet by mouth daily.     busPIRone (BUSPAR) 15 MG tablet Take 15 mg by mouth 2 (two) times daily.      fluticasone-salmeterol (ADVAIR) 100-50 MCG/ACT AEPB      furosemide (LASIX) 40 MG tablet      losartan  (COZAAR ) 100 MG tablet TAKE 1 TABLET ONE TIME DAILY     losartan  (COZAAR ) 100 MG tablet Take 1 tablet by mouth daily.     potassium chloride (KLOR-CON) 10 MEQ tablet      sertraline  (ZOLOFT ) 100 MG tablet      ticagrelor  (BRILINTA ) 90 MG TABS tablet Take 1 tablet (90 mg total) by mouth 2 (two) times daily. 60 tablet 4   amLODipine  (NORVASC ) 10 MG tablet Take 10 mg by mouth daily.      aspirin  EC 81 MG tablet Take 81 mg by mouth daily. (Patient not taking: Reported on 11/08/2023)     naltrexone (DEPADE) 50 MG tablet Take 50 mg by mouth daily. (Patient not taking: Reported on 11/08/2023)     sertraline  (ZOLOFT ) 50 MG tablet Take 50 mg by mouth daily.  (Patient not taking: Reported on 11/08/2023)     No current facility-administered medications on file prior to visit.    There are no Patient Instructions on file for this visit. No follow-ups on file.   Ines Warf E Rashad Obeid, NP

## 2023-11-15 LAB — VAS US ABI WITH/WO TBI
Left ABI: 1.03
Right ABI: 1.07

## 2023-11-26 ENCOUNTER — Encounter (INDEPENDENT_AMBULATORY_CARE_PROVIDER_SITE_OTHER): Payer: Self-pay

## 2024-01-31 DIAGNOSIS — M25561 Pain in right knee: Secondary | ICD-10-CM | POA: Diagnosis not present

## 2024-01-31 DIAGNOSIS — M25562 Pain in left knee: Secondary | ICD-10-CM | POA: Diagnosis not present

## 2024-02-14 DIAGNOSIS — I700 Atherosclerosis of aorta: Secondary | ICD-10-CM | POA: Diagnosis not present

## 2024-02-14 DIAGNOSIS — E1122 Type 2 diabetes mellitus with diabetic chronic kidney disease: Secondary | ICD-10-CM | POA: Diagnosis not present

## 2024-02-14 DIAGNOSIS — E7800 Pure hypercholesterolemia, unspecified: Secondary | ICD-10-CM | POA: Diagnosis not present

## 2024-02-14 DIAGNOSIS — N1831 Chronic kidney disease, stage 3a: Secondary | ICD-10-CM | POA: Diagnosis not present

## 2024-02-14 DIAGNOSIS — I2510 Atherosclerotic heart disease of native coronary artery without angina pectoris: Secondary | ICD-10-CM | POA: Diagnosis not present

## 2024-02-21 DIAGNOSIS — Z1331 Encounter for screening for depression: Secondary | ICD-10-CM | POA: Diagnosis not present

## 2024-02-21 DIAGNOSIS — N1831 Chronic kidney disease, stage 3a: Secondary | ICD-10-CM | POA: Diagnosis not present

## 2024-02-21 DIAGNOSIS — I2510 Atherosclerotic heart disease of native coronary artery without angina pectoris: Secondary | ICD-10-CM | POA: Diagnosis not present

## 2024-02-21 DIAGNOSIS — J4489 Other specified chronic obstructive pulmonary disease: Secondary | ICD-10-CM | POA: Diagnosis not present

## 2024-02-21 DIAGNOSIS — I129 Hypertensive chronic kidney disease with stage 1 through stage 4 chronic kidney disease, or unspecified chronic kidney disease: Secondary | ICD-10-CM | POA: Diagnosis not present

## 2024-02-21 DIAGNOSIS — E1122 Type 2 diabetes mellitus with diabetic chronic kidney disease: Secondary | ICD-10-CM | POA: Diagnosis not present

## 2024-02-21 DIAGNOSIS — Z0000 Encounter for general adult medical examination without abnormal findings: Secondary | ICD-10-CM | POA: Diagnosis not present

## 2024-02-21 DIAGNOSIS — F1020 Alcohol dependence, uncomplicated: Secondary | ICD-10-CM | POA: Diagnosis not present

## 2024-02-21 DIAGNOSIS — K7010 Alcoholic hepatitis without ascites: Secondary | ICD-10-CM | POA: Diagnosis not present

## 2024-05-08 ENCOUNTER — Emergency Department

## 2024-05-08 ENCOUNTER — Inpatient Hospital Stay
Admission: EM | Admit: 2024-05-08 | Discharge: 2024-05-10 | DRG: 190 | Disposition: A | Discharge status: Home / Self Care | Attending: Family Medicine | Admitting: Family Medicine

## 2024-05-08 ENCOUNTER — Other Ambulatory Visit: Payer: Self-pay

## 2024-05-08 DIAGNOSIS — F1020 Alcohol dependence, uncomplicated: Secondary | ICD-10-CM | POA: Diagnosis present

## 2024-05-08 DIAGNOSIS — E119 Type 2 diabetes mellitus without complications: Secondary | ICD-10-CM

## 2024-05-08 DIAGNOSIS — R109 Unspecified abdominal pain: Secondary | ICD-10-CM | POA: Diagnosis not present

## 2024-05-08 DIAGNOSIS — N281 Cyst of kidney, acquired: Secondary | ICD-10-CM | POA: Diagnosis not present

## 2024-05-08 DIAGNOSIS — Z7951 Long term (current) use of inhaled steroids: Secondary | ICD-10-CM | POA: Diagnosis not present

## 2024-05-08 DIAGNOSIS — E1122 Type 2 diabetes mellitus with diabetic chronic kidney disease: Secondary | ICD-10-CM | POA: Diagnosis present

## 2024-05-08 DIAGNOSIS — E7800 Pure hypercholesterolemia, unspecified: Secondary | ICD-10-CM | POA: Diagnosis present

## 2024-05-08 DIAGNOSIS — R350 Frequency of micturition: Secondary | ICD-10-CM | POA: Diagnosis present

## 2024-05-08 DIAGNOSIS — R55 Syncope and collapse: Secondary | ICD-10-CM | POA: Diagnosis not present

## 2024-05-08 DIAGNOSIS — Z87891 Personal history of nicotine dependence: Secondary | ICD-10-CM

## 2024-05-08 DIAGNOSIS — Z5971 Insufficient health insurance coverage: Secondary | ICD-10-CM

## 2024-05-08 DIAGNOSIS — Z886 Allergy status to analgesic agent status: Secondary | ICD-10-CM

## 2024-05-08 DIAGNOSIS — I10 Essential (primary) hypertension: Secondary | ICD-10-CM | POA: Diagnosis not present

## 2024-05-08 DIAGNOSIS — F3341 Major depressive disorder, recurrent, in partial remission: Secondary | ICD-10-CM | POA: Diagnosis present

## 2024-05-08 DIAGNOSIS — Z7982 Long term (current) use of aspirin: Secondary | ICD-10-CM | POA: Diagnosis not present

## 2024-05-08 DIAGNOSIS — E872 Acidosis, unspecified: Secondary | ICD-10-CM | POA: Diagnosis present

## 2024-05-08 DIAGNOSIS — Z8419 Family history of other disorders of kidney and ureter: Secondary | ICD-10-CM

## 2024-05-08 DIAGNOSIS — J4489 Other specified chronic obstructive pulmonary disease: Secondary | ICD-10-CM | POA: Diagnosis not present

## 2024-05-08 DIAGNOSIS — Z833 Family history of diabetes mellitus: Secondary | ICD-10-CM

## 2024-05-08 DIAGNOSIS — J208 Acute bronchitis due to other specified organisms: Secondary | ICD-10-CM | POA: Diagnosis present

## 2024-05-08 DIAGNOSIS — I6782 Cerebral ischemia: Secondary | ICD-10-CM | POA: Diagnosis not present

## 2024-05-08 DIAGNOSIS — T380X5A Adverse effect of glucocorticoids and synthetic analogues, initial encounter: Secondary | ICD-10-CM | POA: Diagnosis present

## 2024-05-08 DIAGNOSIS — J690 Pneumonitis due to inhalation of food and vomit: Secondary | ICD-10-CM | POA: Diagnosis present

## 2024-05-08 DIAGNOSIS — M1990 Unspecified osteoarthritis, unspecified site: Secondary | ICD-10-CM | POA: Diagnosis present

## 2024-05-08 DIAGNOSIS — R251 Tremor, unspecified: Secondary | ICD-10-CM | POA: Diagnosis present

## 2024-05-08 DIAGNOSIS — J984 Other disorders of lung: Secondary | ICD-10-CM | POA: Diagnosis present

## 2024-05-08 DIAGNOSIS — I2510 Atherosclerotic heart disease of native coronary artery without angina pectoris: Secondary | ICD-10-CM | POA: Diagnosis present

## 2024-05-08 DIAGNOSIS — R0609 Other forms of dyspnea: Secondary | ICD-10-CM

## 2024-05-08 DIAGNOSIS — R3581 Nocturnal polyuria: Secondary | ICD-10-CM | POA: Diagnosis not present

## 2024-05-08 DIAGNOSIS — Z7902 Long term (current) use of antithrombotics/antiplatelets: Secondary | ICD-10-CM | POA: Diagnosis not present

## 2024-05-08 DIAGNOSIS — E1151 Type 2 diabetes mellitus with diabetic peripheral angiopathy without gangrene: Secondary | ICD-10-CM | POA: Diagnosis present

## 2024-05-08 DIAGNOSIS — R0602 Shortness of breath: Secondary | ICD-10-CM | POA: Diagnosis present

## 2024-05-08 DIAGNOSIS — N179 Acute kidney failure, unspecified: Secondary | ICD-10-CM | POA: Insufficient documentation

## 2024-05-08 DIAGNOSIS — Z79899 Other long term (current) drug therapy: Secondary | ICD-10-CM

## 2024-05-08 DIAGNOSIS — J440 Chronic obstructive pulmonary disease with acute lower respiratory infection: Principal | ICD-10-CM | POA: Diagnosis present

## 2024-05-08 DIAGNOSIS — H524 Presbyopia: Secondary | ICD-10-CM | POA: Diagnosis not present

## 2024-05-08 DIAGNOSIS — I129 Hypertensive chronic kidney disease with stage 1 through stage 4 chronic kidney disease, or unspecified chronic kidney disease: Secondary | ICD-10-CM | POA: Diagnosis present

## 2024-05-08 DIAGNOSIS — E871 Hypo-osmolality and hyponatremia: Secondary | ICD-10-CM | POA: Diagnosis present

## 2024-05-08 DIAGNOSIS — N1832 Chronic kidney disease, stage 3b: Secondary | ICD-10-CM | POA: Diagnosis present

## 2024-05-08 DIAGNOSIS — J441 Chronic obstructive pulmonary disease with (acute) exacerbation: Secondary | ICD-10-CM | POA: Diagnosis present

## 2024-05-08 DIAGNOSIS — R918 Other nonspecific abnormal finding of lung field: Secondary | ICD-10-CM | POA: Diagnosis not present

## 2024-05-08 DIAGNOSIS — Z955 Presence of coronary angioplasty implant and graft: Secondary | ICD-10-CM

## 2024-05-08 DIAGNOSIS — I672 Cerebral atherosclerosis: Secondary | ICD-10-CM | POA: Diagnosis not present

## 2024-05-08 LAB — BASIC METABOLIC PANEL WITH GFR
Anion gap: 12 (ref 5–15)
BUN: 27 mg/dL — ABNORMAL HIGH (ref 8–23)
CO2: 25 mmol/L (ref 22–32)
Calcium: 9.2 mg/dL (ref 8.9–10.3)
Chloride: 97 mmol/L — ABNORMAL LOW (ref 98–111)
Creatinine, Ser: 1.44 mg/dL — ABNORMAL HIGH (ref 0.61–1.24)
GFR, Estimated: 51 mL/min — ABNORMAL LOW (ref >60)
Glucose, Bld: 124 mg/dL — ABNORMAL HIGH (ref 70–99)
Potassium: 4.4 mmol/L (ref 3.5–5.1)
Sodium: 134 mmol/L — ABNORMAL LOW (ref 135–145)

## 2024-05-08 LAB — LIPASE, BLOOD: Lipase: 53 U/L — ABNORMAL HIGH (ref 11–51)

## 2024-05-08 LAB — CBC
HCT: 42.2 % (ref 39.0–52.0)
Hemoglobin: 14.4 g/dL (ref 13.0–17.0)
MCH: 34.4 pg — ABNORMAL HIGH (ref 26.0–34.0)
MCHC: 34.1 g/dL (ref 30.0–36.0)
MCV: 101 fL — ABNORMAL HIGH (ref 80.0–100.0)
Platelets: 289 K/uL (ref 150–400)
RBC: 4.18 MIL/uL — ABNORMAL LOW (ref 4.22–5.81)
RDW: 12.6 % (ref 11.5–15.5)
WBC: 9.3 K/uL (ref 4.0–10.5)
nRBC: 0 % (ref 0.0–0.2)

## 2024-05-08 LAB — URINALYSIS, ROUTINE W REFLEX MICROSCOPIC
Bilirubin Urine: NEGATIVE
Glucose, UA: NEGATIVE mg/dL
Hgb urine dipstick: NEGATIVE
Ketones, ur: NEGATIVE mg/dL
Leukocytes,Ua: NEGATIVE
Nitrite: NEGATIVE
Protein, ur: NEGATIVE mg/dL
Specific Gravity, Urine: 1.024 (ref 1.005–1.030)
pH: 7 (ref 5.0–8.0)

## 2024-05-08 LAB — RESP PANEL BY RT-PCR (RSV, FLU A&B, COVID)  RVPGX2
Influenza A by PCR: NEGATIVE
Influenza B by PCR: NEGATIVE
Resp Syncytial Virus by PCR: NEGATIVE
SARS Coronavirus 2 by RT PCR: NEGATIVE

## 2024-05-08 LAB — LACTIC ACID, PLASMA
Lactic Acid, Venous: 1.9 mmol/L (ref 0.5–1.9)
Lactic Acid, Venous: 4.7 mmol/L (ref 0.5–1.9)

## 2024-05-08 LAB — HEPATIC FUNCTION PANEL
ALT: 20 U/L (ref 0–44)
AST: 26 U/L (ref 15–41)
Albumin: 4.8 g/dL (ref 3.5–5.0)
Alkaline Phosphatase: 84 U/L (ref 38–126)
Bilirubin, Direct: 0.1 mg/dL (ref 0.0–0.2)
Indirect Bilirubin: 0.8 mg/dL (ref 0.3–0.9)
Total Bilirubin: 0.9 mg/dL (ref 0.0–1.2)
Total Protein: 8.1 g/dL (ref 6.5–8.1)

## 2024-05-08 LAB — BRAIN NATRIURETIC PEPTIDE: B Natriuretic Peptide: 53.3 pg/mL (ref 0.0–100.0)

## 2024-05-08 LAB — TROPONIN I (HIGH SENSITIVITY)
Troponin I (High Sensitivity): 9 ng/L (ref <18)
Troponin I (High Sensitivity): 9 ng/L (ref <18)

## 2024-05-08 MED ORDER — ALUM & MAG HYDROXIDE-SIMETH 200-200-20 MG/5ML PO SUSP
30.0000 mL | ORAL | Status: DC | PRN
  Administered 2024-05-08 – 2024-05-09 (×2): 30 mL via ORAL
  Filled 2024-05-08 (×2): qty 30

## 2024-05-08 MED ORDER — SODIUM CHLORIDE 0.9 % IV SOLN: INTRAVENOUS | Status: DC

## 2024-05-08 MED ORDER — HEPARIN SODIUM (PORCINE) 5000 UNIT/ML IJ SOLN
5000.0000 [IU] | Freq: Three times a day (TID) | INTRAMUSCULAR | Status: DC
  Administered 2024-05-08 – 2024-05-10 (×6): 5000 [IU] via SUBCUTANEOUS
  Filled 2024-05-08 (×6): qty 1

## 2024-05-08 MED ORDER — IPRATROPIUM-ALBUTEROL 0.5-2.5 (3) MG/3ML IN SOLN
3.0000 mL | Freq: Four times a day (QID) | RESPIRATORY_TRACT | Status: AC
  Administered 2024-05-08 – 2024-05-09 (×3): 3 mL via RESPIRATORY_TRACT
  Filled 2024-05-08 (×2): qty 3

## 2024-05-08 MED ORDER — HYDROCOD POLI-CHLORPHE POLI ER 10-8 MG/5ML PO SUER
5.0000 mL | Freq: Every evening | ORAL | Status: DC | PRN
  Administered 2024-05-08 – 2024-05-09 (×2): 5 mL via ORAL
  Filled 2024-05-08 (×2): qty 5

## 2024-05-08 MED ORDER — ATORVASTATIN CALCIUM 20 MG PO TABS
20.0000 mg | ORAL_TABLET | Freq: Every day | ORAL | Status: DC
  Administered 2024-05-09 – 2024-05-10 (×2): 20 mg via ORAL
  Filled 2024-05-08 (×2): qty 1

## 2024-05-08 MED ORDER — SODIUM CHLORIDE 0.9 % IV SOLN
3.0000 g | Freq: Four times a day (QID) | INTRAVENOUS | Status: DC
  Administered 2024-05-08 – 2024-05-09 (×4): 3 g via INTRAVENOUS
  Filled 2024-05-08 (×5): qty 8

## 2024-05-08 MED ORDER — LORAZEPAM 0.5 MG PO TABS: 0.5000 mg | ORAL_TABLET | Freq: Four times a day (QID) | ORAL | Status: DC | PRN

## 2024-05-08 MED ORDER — SENNOSIDES-DOCUSATE SODIUM 8.6-50 MG PO TABS: 1.0000 | ORAL_TABLET | Freq: Every evening | ORAL | Status: DC | PRN

## 2024-05-08 MED ORDER — FLUTICASONE FUROATE-VILANTEROL 100-25 MCG/ACT IN AEPB
1.0000 | INHALATION_SPRAY | Freq: Every day | RESPIRATORY_TRACT | Status: DC
Start: 2024-05-08 — End: 2024-05-10
  Administered 2024-05-08 – 2024-05-10 (×2): 1 via RESPIRATORY_TRACT
  Filled 2024-05-08: qty 28

## 2024-05-08 MED ORDER — ONDANSETRON HCL 4 MG PO TABS: 4.0000 mg | ORAL_TABLET | Freq: Four times a day (QID) | ORAL | Status: DC | PRN

## 2024-05-08 MED ORDER — METHYLPREDNISOLONE SODIUM SUCC 125 MG IJ SOLR
125.0000 mg | Freq: Once | INTRAMUSCULAR | Status: AC
  Administered 2024-05-08: 125 mg via INTRAVENOUS
  Filled 2024-05-08: qty 2

## 2024-05-08 MED ORDER — ACETAMINOPHEN 650 MG RE SUPP: 650.0000 mg | Freq: Four times a day (QID) | RECTAL | Status: DC | PRN

## 2024-05-08 MED ORDER — ONDANSETRON HCL 4 MG/2ML IJ SOLN: 4.0000 mg | Freq: Four times a day (QID) | INTRAMUSCULAR | Status: DC | PRN

## 2024-05-08 MED ORDER — ALBUTEROL SULFATE (2.5 MG/3ML) 0.083% IN NEBU: 2.5000 mg | INHALATION_SOLUTION | Freq: Once | RESPIRATORY_TRACT | Status: DC

## 2024-05-08 MED ORDER — ACETAMINOPHEN 325 MG PO TABS
650.0000 mg | ORAL_TABLET | Freq: Four times a day (QID) | ORAL | Status: DC | PRN
  Administered 2024-05-09 (×2): 650 mg via ORAL
  Filled 2024-05-08 (×2): qty 2

## 2024-05-08 MED ORDER — METHYLPREDNISOLONE SODIUM SUCC 40 MG IJ SOLR
40.0000 mg | Freq: Every day | INTRAMUSCULAR | Status: AC
  Administered 2024-05-09: 40 mg via INTRAVENOUS
  Filled 2024-05-08: qty 1

## 2024-05-08 MED ORDER — SODIUM CHLORIDE 0.9 % IV BOLUS
1000.0000 mL | Freq: Once | INTRAVENOUS | Status: AC
  Administered 2024-05-08: 1000 mL via INTRAVENOUS

## 2024-05-08 MED ORDER — IOHEXOL 350 MG/ML SOLN
100.0000 mL | Freq: Once | INTRAVENOUS | Status: AC | PRN
Start: 2024-05-08 — End: 2024-05-08
  Administered 2024-05-08: 100 mL via INTRAVENOUS

## 2024-05-08 MED ORDER — GUAIFENESIN 100 MG/5ML PO LIQD
5.0000 mL | Freq: Four times a day (QID) | ORAL | Status: DC | PRN
  Administered 2024-05-09 (×2): 5 mL via ORAL
  Filled 2024-05-08 (×2): qty 10

## 2024-05-08 MED ORDER — IPRATROPIUM-ALBUTEROL 0.5-2.5 (3) MG/3ML IN SOLN
9.0000 mL | Freq: Once | RESPIRATORY_TRACT | Status: AC
  Administered 2024-05-08: 9 mL via RESPIRATORY_TRACT
  Filled 2024-05-08: qty 6

## 2024-05-08 MED ORDER — PANTOPRAZOLE SODIUM 40 MG PO TBEC
40.0000 mg | DELAYED_RELEASE_TABLET | Freq: Every day | ORAL | Status: DC
  Administered 2024-05-08 – 2024-05-10 (×3): 40 mg via ORAL
  Filled 2024-05-08 (×3): qty 1

## 2024-05-08 MED ORDER — AMLODIPINE BESYLATE 5 MG PO TABS
5.0000 mg | ORAL_TABLET | Freq: Every day | ORAL | Status: DC
  Administered 2024-05-09 – 2024-05-10 (×2): 5 mg via ORAL
  Filled 2024-05-08 (×2): qty 1

## 2024-05-08 NOTE — ED Provider Notes (Signed)
 Clinical Course as of 05/08/24 2356  Fri May 08, 2024  1459 Received signout: presenting for sob and presyncopal episode. Ho of COPD and etoh, last drink 2 weeks. Significant exertional dyspnea. Wheezes, no history of heartfailure. Plan for admission.  [HD]  1624 CT Angio Chest PE W and/or Wo Contrast No PE consistent with bronchitis no pneumonia [HD]  1624 CT ABDOMEN PELVIS W CONTRAST No intra-abdominal abnormality, [HD]  1628 I reviewed the results of the workup with the patient and his daughter at bedside.  On physical exam patient is still very wheezy and have ordered him another albuterol nebulizing treatment.  I do think he needs to come in as per the prior physician and will admit him to the hospitalist [HD]  1648 Case discussed with hospitalist for admission [HD]    Clinical Course User Index [HD] Nicholaus Rolland BRAVO, MD      Nicholaus Rolland BRAVO, MD 05/08/24 2356

## 2024-05-08 NOTE — ED Provider Notes (Signed)
 [facility] Provider Note    Event Date/Time   First MD Initiated Contact with Patient 05/08/24 1313     (approximate)   History   Loss of Consciousness   HPI  Jack Henson is a 73 y.o. male past medical history significant for alcoholic hepatitis, alcoholism, COPD, CAD, diabetes, hypertension presents to the emergency department with shortness of breath and a presyncopal episode.  History is provided the by the patient and his daughter at bedside.  States that he has been having significant shortness of breath over the past 1 month that has been progressively worsening.  States that he has been sleeping in a recliner for quite some time.  States that he is now only able to minimally ambulate across the room without getting very short of breath and having to stop to catch his breath.  Some chest tightness and pain to his right abdomen.  Denies any recent falls or head trauma.  Today whenever he had a coughing episode witnessed by his daughter appeared to lose consciousness and zoned out for a little while and look like he was going to pass out.  Now is back to his baseline.  Denies any significant chest pain.  Last drink of alcohol was 2 weeks ago.  Denies any history of alcohol withdrawal seizures.  No falls or head trauma.  No longer with tobacco use.     Physical Exam   Triage Vital Signs: ED Triage Vitals  Encounter Vitals Group     BP 05/08/24 1246 130/78     Girls Systolic BP Percentile --      Girls Diastolic BP Percentile --      Boys Systolic BP Percentile --      Boys Diastolic BP Percentile --      Pulse Rate 05/08/24 1246 98     Resp 05/08/24 1246 18     Temp 05/08/24 1246 97.8 F (36.6 C)     Temp Source 05/08/24 1246 Oral     SpO2 05/08/24 1246 97 %     Weight 05/08/24 1242 150 lb (68 kg)     Height 05/08/24 1242 5' 6 (1.676 m)     Head Circumference --      Peak Flow --      Pain Score 05/08/24 1243 0     Pain Loc --      Pain  Education --      Exclude from Growth Chart --     Most recent vital signs: Vitals:   05/08/24 1307 05/08/24 1500  BP: 99/67 (!) 141/72  Pulse: 93 82  Resp:  13  Temp:    SpO2: 95% 99%    Physical Exam Constitutional:      Appearance: He is well-developed.  HENT:     Head: Atraumatic.     Mouth/Throat:     Mouth: Mucous membranes are dry.  Eyes:     Extraocular Movements: Extraocular movements intact.     Conjunctiva/sclera: Conjunctivae normal.     Pupils: Pupils are equal, round, and reactive to light.  Cardiovascular:     Rate and Rhythm: Regular rhythm.     Heart sounds: No murmur heard. Pulmonary:     Effort: Respiratory distress present.     Breath sounds: Wheezing and rhonchi present.     Comments: Tachypneic, speaking in one-word sentences.  97% on room air at rest.  When standing up the patient and ambulating minimally has significant increased work of breathing  and asked to sit down.  Diffuse wheezing on exam. Musculoskeletal:     Cervical back: Normal range of motion.  Skin:    General: Skin is warm.     Capillary Refill: Capillary refill takes less than 2 seconds.  Neurological:     General: No focal deficit present.     Mental Status: He is alert. Mental status is at baseline.  Psychiatric:        Mood and Affect: Mood normal.     IMPRESSION / MDM / ASSESSMENT AND PLAN / ED COURSE  I reviewed the triage vital signs and the nursing notes.  Differential diagnosis including ACS, anemia, pulmonary embolism, heart failure exacerbation, COPD exacerbation   EKG  I, Clotilda Punter, the attending physician, personally viewed and interpreted this ECG.  EKG showed normal sinus rhythm.  First-degree AV block.  Nonspecific ST changes.  No findings of acute ischemia or dysrhythmia. No tachycardic or bradycardic dysrhythmias while on cardiac telemetry.  RADIOLOGY I independently reviewed imaging, my interpretation of imaging: Chest x-ray with right apical  density  CT scan of the head, CTA PE and CT abdomen and pelvis ordered and currently pending.  LABS (all labs ordered are listed, but only abnormal results are displayed) Labs interpreted as -    Labs Reviewed  BASIC METABOLIC PANEL WITH GFR - Abnormal; Notable for the following components:      Result Value   Sodium 134 (*)    Chloride 97 (*)    Glucose, Bld 124 (*)    BUN 27 (*)    Creatinine, Ser 1.44 (*)    GFR, Estimated 51 (*)    All other components within normal limits  CBC - Abnormal; Notable for the following components:   RBC 4.18 (*)    MCV 101.0 (*)    MCH 34.4 (*)    All other components within normal limits  LIPASE, BLOOD - Abnormal; Notable for the following components:   Lipase 53 (*)    All other components within normal limits  RESP PANEL BY RT-PCR (RSV, FLU A&B, COVID)  RVPGX2  BRAIN NATRIURETIC PEPTIDE  HEPATIC FUNCTION PANEL  LACTIC ACID, PLASMA  LACTIC ACID, PLASMA  TROPONIN I (HIGH SENSITIVITY)  TROPONIN I (HIGH SENSITIVITY)     MDM   Significant wheezing on exam, given DuoNeb treatment and IV Solu-Medrol.  Mild acute kidney injury with creatinine up to 1.4 from a baseline of 0.9 he does have an elevated BUN of 27.  Mild hyponatremia at 134.  No significant leukocytosis or anemia.  BNP is within normal limits and has a normal troponin.  Normal LFTs.  Low risk Wells criteria, CTA is currently pending.  Patient would likely need admission for COPD exacerbation and significant exertional dyspnea.  Care transferred to incoming provider with CT imaging pending.  PROCEDURES:  Critical Care performed: No  Procedures  Patient's presentation is most consistent with acute presentation with potential threat to life or bodily function.   MEDICATIONS ORDERED IN ED: Medications  ipratropium-albuterol (DUONEB) 0.5-2.5 (3) MG/3ML nebulizer solution 9 mL (9 mLs Nebulization Given 05/08/24 1445)  methylPREDNISolone sodium succinate (SOLU-MEDROL) 125  mg/2 mL injection 125 mg (125 mg Intravenous Given 05/08/24 1450)  sodium chloride  0.9 % bolus 1,000 mL (1,000 mLs Intravenous New Bag/Given 05/08/24 1522)  iohexol (OMNIPAQUE) 350 MG/ML injection 100 mL (100 mLs Intravenous Contrast Given 05/08/24 1528)    FINAL CLINICAL IMPRESSION(S) / ED DIAGNOSES   Final diagnoses:  Syncope and collapse  Exertional dyspnea  Shortness of breath  COPD with acute exacerbation (HCC)     Rx / DC Orders   ED Discharge Orders     None        Note:  This document was prepared using Dragon voice recognition software and may include unintentional dictation errors.   Suzanne Kirsch, MD 05/08/24 [phone]

## 2024-05-08 NOTE — Assessment & Plan Note (Signed)
Home atorvastatin 20 mg nightly resumed

## 2024-05-08 NOTE — H&P (Signed)
 History and Physical   Jack Henson FMW:[id number] DOB: 20-Dec-1950 DOA: 05/08/2024  PCP: Lenon Layman ORN, MD  Patient coming from: Home  I have personally briefly reviewed patient's old medical records in [facility] EMR.  Chief Concern: Shortness of breath, pre syncope  HPI: [name] is a 73 year old male with history of alcoholic hepatitis, alcoholism, COPD, CAD, diabetes, hypertension, presents ED for chief concerns of shortness of breath and presyncopal episode.  Vitals in the ED showed t of 97.8, rr 18, hr 90, blood pressure 130/78, SpO2 97% on room air.  Serum sodium is 134, potassium 4.4, chloride 97, bicarb 25, BUN of 27, serum creatinine of 1.44, eGFR 51, nonfasting blood glucose 124, WBC 9.3, hemoglobin 14.4, platelets of 289.   Lipase was 53, BNP 53.3, high sensitive troponin is 9, lactic acid 1.9.  ED treatment: DuoNebs, albuterol nebulizer, Solu-Medrol 125 mg IV one-time dose, sodium chloride  1 L bolus. ------------------------------------- At bedside, patient is able to tell me his first last name, age and location, current calendar year.  He reports he has been having increased cough/productive of clear sputum for the last month or month and a half.  He reports that he had to stop taking his maintenance inhaler due to cost.  He reports that he is prescribed Advair and 1 does cost him $240 per month.  He reports that last year it was free and therefore he was able to take his medication.  He endorses wheezing as well.  He denies chest pain, abdominal pain, dysuria, hematuria, diarrhea, blood in his stool, blood in his urine.  Reports he has been urinating a lot at night.  He reports he does take a fluid pill however he takes it in the mornings.  Social history: He lives at home.  He endorses current tobacco smoking.  He is a former alcohol user, last drink was nearly 2 weeks ago.  He denies recreational drug use.  He is retired.  ROS: Constitutional: no weight  change, no fever ENT/Mouth: no sore throat, no rhinorrhea Eyes: no eye pain, no vision changes Cardiovascular: no chest pain, no dyspnea,  no edema, no palpitations Respiratory: no cough, no sputum, no wheezing Gastrointestinal: no nausea, no vomiting, no diarrhea, no constipation Genitourinary: no urinary incontinence, no dysuria, no hematuria Musculoskeletal: no arthralgias, no myalgias Skin: no skin lesions, no pruritus, Neuro: + weakness, no loss of consciousness, no syncope Psych: no anxiety, no depression, + decrease appetite Heme/Lymph: no bruising, no bleeding  ED Course: Discussed with EDP, patient requiring hospitalization for chief concerns of COPD exacerbation.  Assessment/Plan  Principal Problem:   COPD exacerbation (HCC) Active Problems:   Diabetes (HCC)   Essential hypertension, benign   COPD (chronic obstructive pulmonary disease) with chronic bronchitis (HCC)   Depression, major, recurrent, in partial remission   Hypercholesterolemia   Aspiration pneumonia (HCC)   AKI (acute kidney injury)   Nocturnal polyuria   Assessment and Plan:  * COPD exacerbation (HCC) Suspect secondary to not taking inhaler maintenance due to inability to pay as Advair cost $240 a month for 1 disc Patient reports last year the Advair was free with his Medicare DuoNebs 4 times daily, 4 doses ordered Solu-Medrol 40 mg IV for 05/09/2024, one-time dose Continuous pulse oximetry  Nocturnal polyuria Suspect secondary to prostate enlargement With PCP for recommendation and management  AKI (acute kidney injury) Status post sodium chloride  1 L bolus per EDP Continue with sodium chloride  infusion at 100 mL/h, 1 day ordered Recheck  BMP in the a.m.  Aspiration pneumonia (HCC) Suspect the aspiration pneumonia per CT read Unasyn per pharmacy  Hypercholesterolemia Home atorvastatin  20 mg nightly resumed  Essential hypertension, benign Home lisinopril and Lasix not resumed on admission  due to acute kidney injury AM team to resume when benefits outweigh the risk Amlodipine  5 mg daily resumed  Chart reviewed.   DVT prophylaxis: Heparin  5000 units subcutaneous every 8 hours Code Status: Full code Diet: Heart healthy Family Communication: Stated daughter at bedside with patient's permission Disposition Plan: Pending clinical course Consults called: Pharmacy Admission status: Telemetry, observation  Past Medical History:  Diagnosis Date   Arthritis    COPD (chronic obstructive pulmonary disease) (HCC)    Diabetes mellitus without complication (HCC)    Hyperlipidemia    Hypertension    Past Surgical History:  Procedure Laterality Date   ANKLE SURGERY Right    CORONARY STENT INTERVENTION N/A 08/26/2017   Procedure: CORONARY STENT INTERVENTION;  Surgeon: Florencio Cara BIRCH, MD;  Location: [facility] INVASIVE CV LAB;  Service: Cardiovascular;  Laterality: N/A;   LEFT HEART CATH AND CORONARY ANGIOGRAPHY Left 08/26/2017   Procedure: LEFT HEART CATH AND CORONARY ANGIOGRAPHY;  Surgeon: Hester Wolm PARAS, MD;  Location: [facility] INVASIVE CV LAB;  Service: Cardiovascular;  Laterality: Left;   LEG SURGERY     stent placement   SHOULDER SURGERY Right    Social History:  reports that he quit smoking about 10 years ago. His smoking use included cigarettes. He started smoking about 55 years ago. He has a 45 pack-year smoking history. He has never used smokeless tobacco. He reports current alcohol use. He reports current drug use. Drug: Marijuana.  Allergies  Allergen Reactions   Aspirin  Other (See Comments)    Hepatitis and alcohol abuse   Family History  Problem Relation Age of Onset   Kidney disease Mother    Diabetes Father    Family history: Family history reviewed and not pertinent.  Prior to Admission medications   Medication Sig Start Date End Date Taking? Authorizing Provider  albuterol (VENTOLIN HFA) 108 (90 Base) MCG/ACT inhaler Inhale into the lungs. 08/08/23   [provider]  amLODipine  (NORVASC ) 10 MG tablet Take 10 mg by mouth daily.  11/22/15 08/26/17  [provider]  amLODipine  (NORVASC ) 5 MG tablet  02/08/21   [provider]  aspirin  EC 81 MG tablet Take 81 mg by mouth daily. Patient not taking: Reported on 11/08/2023    [provider]  Aspirin -Caffeine (BC FAST PAIN RELIEF PO) Take 1 each by mouth.    [provider]  atorvastatin  (LIPITOR) 20 MG tablet Take 1 tablet by mouth daily. 11/09/20   [provider]  busPIRone (BUSPAR) 15 MG tablet Take 15 mg by mouth 2 (two) times daily.    [provider]  fluticasone-salmeterol (ADVAIR) 100-50 MCG/ACT AEPB  03/16/21   [provider]  furosemide (LASIX) 40 MG tablet  02/27/21   [provider]  losartan  (COZAAR ) 100 MG tablet TAKE 1 TABLET ONE TIME DAILY 06/11/16   [provider]  losartan  (COZAAR ) 100 MG tablet Take 1 tablet by mouth daily. 11/10/20   [provider]  naltrexone (DEPADE) 50 MG tablet Take 50 mg by mouth daily. Patient not taking: Reported on 11/08/2023    [provider]  potassium chloride (KLOR-CON) 10 MEQ tablet  01/24/21   [provider]  sertraline  (ZOLOFT ) 100 MG tablet  02/28/21   [provider]  sertraline  (ZOLOFT )  50 MG tablet Take 50 mg by mouth daily.  Patient not taking: Reported on 11/08/2023 11/22/15   [provider]  ticagrelor  (BRILINTA ) 90 MG TABS tablet Take 1 tablet (90 mg total) by mouth 2 (two) times daily. 08/27/17   Hester Wolm PARAS, MD   Physical Exam: Vitals:   05/08/24 1242 05/08/24 1246 05/08/24 1307 05/08/24 1500  BP:  130/78 99/67 (!) 141/72  Pulse:  98 93 82  Resp:  18  13  Temp:  97.8 F (36.6 C)    TempSrc:  Oral    SpO2:  97% 95% 99%  Weight: 68 kg     Height: 5' 6 (1.676 m)      Constitutional: appears age appropriate, NAD, calm Eyes: PERRL, lids and conjunctivae normal ENMT: Mucous membranes are moist. Posterior pharynx  clear of any exudate or lesions. Age-appropriate dentition. Hearing appropriate Neck: normal, supple, no masses, no thyromegaly Respiratory: Generalized decreased lung sounds bilaterally and end expiratory wheezing, no crackles. + cough. Normal respiratory effort. No accessory muscle use.  Cardiovascular: Regular rate and rhythm, no murmurs / rubs / gallops. No extremity edema. 2+ pedal pulses. No carotid bruits.  Abdomen: no tenderness, no masses palpated, no hepatosplenomegaly. Bowel sounds positive.  Musculoskeletal: no clubbing / cyanosis. No joint deformity upper and lower extremities. Good ROM, no contractures, no atrophy. Normal muscle tone.  Skin: no rashes, lesions, ulcers. No induration Neurologic: Sensation intact. Strength 5/5 in all 4.  Psychiatric: Normal judgment and insight. Alert and oriented x 3. Normal mood.   EKG: independently reviewed, showing sinus rhythm with rate of 92, QTc 447  Chest x-ray on Admission: I personally reviewed and I agree with radiologist reading as below.  CT ABDOMEN PELVIS W CONTRAST Result Date: 05/08/2024 CLINICAL DATA:  Syncope/presyncope, short of breath, abdominal pain EXAM: CT ANGIOGRAPHY CHEST CT ABDOMEN AND PELVIS WITH CONTRAST TECHNIQUE: Multidetector CT imaging of the chest was performed using the standard protocol during bolus administration of intravenous contrast. Multiplanar CT image reconstructions and MIPs were obtained to evaluate the vascular anatomy. Multidetector CT imaging of the abdomen and pelvis was performed using the standard protocol during bolus administration of intravenous contrast. RADIATION DOSE REDUCTION: This exam was performed according to the departmental dose-optimization program which includes automated exposure control, adjustment of the mA and/or kV according to patient size and/or use of iterative reconstruction technique. CONTRAST:  OMNIPAQUE IOHEXOL 350 MG/ML SOLN COMPARISON:  05/08/2024, 11/04/2020 FINDINGS:  CTA CHEST FINDINGS Cardiovascular: This is a technically adequate evaluation of the pulmonary vasculature. No filling defects or pulmonary emboli. The heart is unremarkable without pericardial effusion. No evidence of thoracic aortic aneurysm or dissection. Atherosclerosis of the aorta and coronary vasculature. Mediastinum/Nodes: No enlarged mediastinal, hilar, or axillary lymph nodes. Thyroid  gland, trachea, and esophagus demonstrate no significant findings. Lungs/Pleura: No acute airspace disease, effusion, or pneumothorax. There is mild bilateral bronchial wall thickening, with segmental areas of opacification within the bilateral lower lobe bronchi consistent with retained secretions or aspiration. Musculoskeletal: No acute or destructive bony abnormalities. There is progressive spondylosis at the T8-9 level, with bony fusion across the disc space. New severe spondylosis at the T7-8 level. Reconstructed images demonstrate no additional findings. Review of the MIP images confirms the above findings. CT ABDOMEN and PELVIS FINDINGS Hepatobiliary: No focal liver abnormality is seen. No gallstones, gallbladder wall thickening, or biliary dilatation. Pancreas: Unremarkable. No pancreatic ductal dilatation or surrounding inflammatory changes. Spleen: Normal in size without focal abnormality. Adrenals/Urinary Tract: Small bilateral renal cortical  cysts do not require specific imaging follow-up. No urinary tract calculi or obstructive uropathy. Vascular calcifications of the bilateral renal hila. The adrenals and bladder appear unremarkable. Stomach/Bowel: No bowel obstruction or ileus. Sigmoid diverticulosis without diverticulitis. No bowel wall thickening or inflammatory change. There is a small fat containing hiatal hernia. Vascular/Lymphatic: Aortic atherosclerosis. No enlarged abdominal or pelvic lymph nodes. Reproductive: Prostate is unremarkable. Other: No free fluid or free intraperitoneal gas. No abdominal wall  hernia. Musculoskeletal: No acute or destructive bony abnormalities. Stable severe spondylosis at the L1-2 level. There is marked facet hypertrophy at L5-S1, with grade 1 degenerative anterolisthesis of L5 on S1. Reconstructed images demonstrate no additional findings. Review of the MIP images confirms the above findings. IMPRESSION: Chest: 1. No evidence of pulmonary embolus. 2. Mild bilateral bronchial wall thickening, with partial opacification of bilateral lower lobe segmental bronchi consistent with bronchitis and retained secretions versus aspiration. No lobar pneumonia. 3. Aortic Atherosclerosis (ICD10-I70.0). Coronary artery atherosclerosis. Abdomen/pelvis: 1. No acute intra-abdominal or intrapelvic process. 2. Sigmoid diverticulosis without diverticulitis. 3.  Aortic Atherosclerosis (ICD10-I70.0). Electronically Signed   By: Ozell Daring M.D.   On: 05/08/2024 16:20   CT Angio Chest PE W and/or Wo Contrast Result Date: 05/08/2024 CLINICAL DATA:  Syncope/presyncope, short of breath, abdominal pain EXAM: CT ANGIOGRAPHY CHEST CT ABDOMEN AND PELVIS WITH CONTRAST TECHNIQUE: Multidetector CT imaging of the chest was performed using the standard protocol during bolus administration of intravenous contrast. Multiplanar CT image reconstructions and MIPs were obtained to evaluate the vascular anatomy. Multidetector CT imaging of the abdomen and pelvis was performed using the standard protocol during bolus administration of intravenous contrast. RADIATION DOSE REDUCTION: This exam was performed according to the departmental dose-optimization program which includes automated exposure control, adjustment of the mA and/or kV according to patient size and/or use of iterative reconstruction technique. CONTRAST:  OMNIPAQUE IOHEXOL 350 MG/ML SOLN COMPARISON:  05/08/2024, 11/04/2020 FINDINGS: CTA CHEST FINDINGS Cardiovascular: This is a technically adequate evaluation of the pulmonary vasculature. No filling  defects or pulmonary emboli. The heart is unremarkable without pericardial effusion. No evidence of thoracic aortic aneurysm or dissection. Atherosclerosis of the aorta and coronary vasculature. Mediastinum/Nodes: No enlarged mediastinal, hilar, or axillary lymph nodes. Thyroid  gland, trachea, and esophagus demonstrate no significant findings. Lungs/Pleura: No acute airspace disease, effusion, or pneumothorax. There is mild bilateral bronchial wall thickening, with segmental areas of opacification within the bilateral lower lobe bronchi consistent with retained secretions or aspiration. Musculoskeletal: No acute or destructive bony abnormalities. There is progressive spondylosis at the T8-9 level, with bony fusion across the disc space. New severe spondylosis at the T7-8 level. Reconstructed images demonstrate no additional findings. Review of the MIP images confirms the above findings. CT ABDOMEN and PELVIS FINDINGS Hepatobiliary: No focal liver abnormality is seen. No gallstones, gallbladder wall thickening, or biliary dilatation. Pancreas: Unremarkable. No pancreatic ductal dilatation or surrounding inflammatory changes. Spleen: Normal in size without focal abnormality. Adrenals/Urinary Tract: Small bilateral renal cortical cysts do not require specific imaging follow-up. No urinary tract calculi or obstructive uropathy. Vascular calcifications of the bilateral renal hila. The adrenals and bladder appear unremarkable. Stomach/Bowel: No bowel obstruction or ileus. Sigmoid diverticulosis without diverticulitis. No bowel wall thickening or inflammatory change. There is a small fat containing hiatal hernia. Vascular/Lymphatic: Aortic atherosclerosis. No enlarged abdominal or pelvic lymph nodes. Reproductive: Prostate is unremarkable. Other: No free fluid or free intraperitoneal gas. No abdominal wall hernia. Musculoskeletal: No acute or destructive bony abnormalities. Stable severe spondylosis at the L1-2 level.  There is marked facet hypertrophy at L5-S1, with grade 1 degenerative anterolisthesis of L5 on S1. Reconstructed images demonstrate no additional findings. Review of the MIP images confirms the above findings. IMPRESSION: Chest: 1. No evidence of pulmonary embolus. 2. Mild bilateral bronchial wall thickening, with partial opacification of bilateral lower lobe segmental bronchi consistent with bronchitis and retained secretions versus aspiration. No lobar pneumonia. 3. Aortic Atherosclerosis (ICD10-I70.0). Coronary artery atherosclerosis. Abdomen/pelvis: 1. No acute intra-abdominal or intrapelvic process. 2. Sigmoid diverticulosis without diverticulitis. 3.  Aortic Atherosclerosis (ICD10-I70.0). Electronically Signed   By: Ozell Daring M.D.   On: 05/08/2024 16:20   CT Head Wo Contrast Result Date: 05/08/2024 EXAM: CT HEAD WITHOUT CONTRAST 05/08/2024 03:43:56 PM TECHNIQUE: CT of the head was performed without the administration of intravenous contrast. Automated exposure control, iterative reconstruction, and/or weight based adjustment of the mA/kV was utilized to reduce the radiation dose to as low as reasonably achievable. COMPARISON: None available. CLINICAL HISTORY: Syncope/presyncope, cerebrovascular cause suspected. FINDINGS: BRAIN AND VENTRICLES: No acute hemorrhage. No evidence of acute infarct. Age-related atrophy. Periventricular and deep white matter hypodensity typical of chronic small vessel ischemia. Remote lacunar infarct in left caudate. No hydrocephalus. No extra-axial collection. No mass effect or midline shift. ORBITS: No acute abnormality. SINUSES: No acute abnormality. SOFT TISSUES AND SKULL: No acute soft tissue abnormality. No skull fracture. Atherosclerosis of skullbase vasculature without hyperdense vessel or abnormal calcification. IMPRESSION: 1. No acute intracranial abnormality. 2. Chronic small vessel ischemic changes with remote left caudate lacunar infarct. 3. Atherosclerosis of  the skull base vasculature without hyperdense vessel. Electronically signed by: Franky Stanford MD 05/08/2024 04:04 PM EDT RP Workstation: [id number]   DG Chest 2 View Result Date: 05/08/2024 EXAM: 2 VIEW(S) XRAY OF THE CHEST 05/08/2024 01:07:49 PM COMPARISON: 11/04/2020 CLINICAL HISTORY: shortness of breath FINDINGS: LUNGS AND PLEURA: Right apical nodular density is noted. No pulmonary edema. No pleural effusion. No pneumothorax. HEART AND MEDIASTINUM: No acute abnormality of the cardiac and mediastinal silhouettes. BONES AND SOFT TISSUES: No acute osseous abnormality. IMPRESSION: 1. No acute process. 2. Right apical nodular density; CT scan of chest is recommended for further evaluation . Electronically signed by: Lynwood Seip MD 05/08/2024 01:41 PM EDT RP Workstation: [id number]   Labs on Admission: I have personally reviewed following labs  CBC: Recent Labs  Lab 05/08/24 1249  WBC 9.3  HGB 14.4  HCT 42.2  MCV 101.0*  PLT 289   Basic Metabolic Panel: Recent Labs  Lab 05/08/24 1249  NA 134*  K 4.4  CL 97*  CO2 25  GLUCOSE 124*  BUN 27*  CREATININE 1.44*  CALCIUM  9.2   GFR: Estimated Creatinine Clearance: 40 mL/min (A) (by C-G formula based on SCr of 1.44 mg/dL (H)).  Liver Function Tests: Recent Labs  Lab 05/08/24 1249  AST 26  ALT 20  ALKPHOS 84  BILITOT 0.9  PROT 8.1  ALBUMIN 4.8   Recent Labs  Lab 05/08/24 1452  LIPASE 53*   Urine analysis:    Component Value Date/Time   COLORURINE YELLOW (A) 05/08/2024 1624   APPEARANCEUR CLEAR (A) 05/08/2024 1624   LABSPEC 1.024 05/08/2024 1624   PHURINE 7.0 05/08/2024 1624   GLUCOSEU NEGATIVE 05/08/2024 1624   HGBUR NEGATIVE 05/08/2024 1624   BILIRUBINUR NEGATIVE 05/08/2024 1624   KETONESUR NEGATIVE 05/08/2024 1624   PROTEINUR NEGATIVE 05/08/2024 1624   NITRITE NEGATIVE 05/08/2024 1624   LEUKOCYTESUR NEGATIVE 05/08/2024 1624   This document was prepared using Dragon Voice Recognition software and may include  unintentional dictation errors.  Dr. Sherre [facility]  If 7PM-7AM, please contact overnight-coverage provider If 7AM-7PM, please contact day attending provider [email or web address]  05/08/2024, 6:18 PM

## 2024-05-08 NOTE — ED Triage Notes (Signed)
 Patient's daughter reports she was with her father when he had a coughing spell, was very short of breath and had syncopal episode lasting a few seconds. Patient reports shortness of breath x a few months.

## 2024-05-08 NOTE — Assessment & Plan Note (Signed)
 Status post sodium chloride  1 L bolus per EDP Continue with sodium chloride  infusion at 100 mL/h, 1 day ordered Recheck BMP in the a.m.

## 2024-05-08 NOTE — Assessment & Plan Note (Signed)
 Suspect secondary to not taking inhaler maintenance due to inability to pay as Advair cost $240 a month for 1 disc Patient reports last year the Advair was free with his Medicare DuoNebs 4 times daily, 4 doses ordered Solu-Medrol 40 mg IV for 05/09/2024, one-time dose Continuous pulse oximetry

## 2024-05-08 NOTE — Hospital Course (Signed)
 [name] is a 73 year old male with history of alcoholic hepatitis, alcoholism, COPD, CAD, diabetes, hypertension, presents ED for chief concerns of shortness of breath and presyncopal episode.  Vitals in the ED showed t of 97.8, rr 18, hr 90, blood pressure 130/78, SpO2 97% on room air.  Serum sodium is 134, potassium 4.4, chloride 97, bicarb 25, BUN of 27, serum creatinine of 1.44, eGFR 51, nonfasting blood glucose 124, WBC 9.3, hemoglobin 14.4, platelets of 289.   Lipase was 53, BNP 53.3, high sensitive troponin is 9, lactic acid 1.9.  ED treatment: DuoNebs, albuterol nebulizer, Solu-Medrol 125 mg IV one-time dose, sodium chloride  1 L bolus.

## 2024-05-08 NOTE — Assessment & Plan Note (Signed)
 Home lisinopril and Lasix not resumed on admission due to acute kidney injury AM team to resume when benefits outweigh the risk Amlodipine  5 mg daily resumed

## 2024-05-08 NOTE — Assessment & Plan Note (Signed)
 Suspect secondary to prostate enlargement With PCP for recommendation and management

## 2024-05-08 NOTE — Assessment & Plan Note (Signed)
 Suspect the aspiration pneumonia per CT read Unasyn per pharmacy

## 2024-05-09 DIAGNOSIS — R3581 Nocturnal polyuria: Secondary | ICD-10-CM

## 2024-05-09 DIAGNOSIS — J440 Chronic obstructive pulmonary disease with acute lower respiratory infection: Secondary | ICD-10-CM | POA: Diagnosis present

## 2024-05-09 DIAGNOSIS — I2510 Atherosclerotic heart disease of native coronary artery without angina pectoris: Secondary | ICD-10-CM | POA: Diagnosis present

## 2024-05-09 DIAGNOSIS — E871 Hypo-osmolality and hyponatremia: Secondary | ICD-10-CM | POA: Diagnosis present

## 2024-05-09 DIAGNOSIS — F3341 Major depressive disorder, recurrent, in partial remission: Secondary | ICD-10-CM | POA: Diagnosis present

## 2024-05-09 DIAGNOSIS — J690 Pneumonitis due to inhalation of food and vomit: Secondary | ICD-10-CM | POA: Diagnosis present

## 2024-05-09 DIAGNOSIS — F1020 Alcohol dependence, uncomplicated: Secondary | ICD-10-CM | POA: Diagnosis present

## 2024-05-09 DIAGNOSIS — Z7902 Long term (current) use of antithrombotics/antiplatelets: Secondary | ICD-10-CM | POA: Diagnosis not present

## 2024-05-09 DIAGNOSIS — J441 Chronic obstructive pulmonary disease with (acute) exacerbation: Secondary | ICD-10-CM | POA: Diagnosis present

## 2024-05-09 DIAGNOSIS — I129 Hypertensive chronic kidney disease with stage 1 through stage 4 chronic kidney disease, or unspecified chronic kidney disease: Secondary | ICD-10-CM | POA: Diagnosis present

## 2024-05-09 DIAGNOSIS — Z79899 Other long term (current) drug therapy: Secondary | ICD-10-CM | POA: Diagnosis not present

## 2024-05-09 DIAGNOSIS — R251 Tremor, unspecified: Secondary | ICD-10-CM | POA: Diagnosis present

## 2024-05-09 DIAGNOSIS — N1832 Chronic kidney disease, stage 3b: Secondary | ICD-10-CM | POA: Diagnosis present

## 2024-05-09 DIAGNOSIS — R0602 Shortness of breath: Secondary | ICD-10-CM | POA: Diagnosis present

## 2024-05-09 DIAGNOSIS — J4489 Other specified chronic obstructive pulmonary disease: Secondary | ICD-10-CM | POA: Diagnosis not present

## 2024-05-09 DIAGNOSIS — E872 Acidosis, unspecified: Secondary | ICD-10-CM | POA: Diagnosis present

## 2024-05-09 DIAGNOSIS — Z7982 Long term (current) use of aspirin: Secondary | ICD-10-CM | POA: Diagnosis not present

## 2024-05-09 DIAGNOSIS — Z87891 Personal history of nicotine dependence: Secondary | ICD-10-CM | POA: Diagnosis not present

## 2024-05-09 DIAGNOSIS — E7800 Pure hypercholesterolemia, unspecified: Secondary | ICD-10-CM

## 2024-05-09 DIAGNOSIS — E119 Type 2 diabetes mellitus without complications: Secondary | ICD-10-CM

## 2024-05-09 DIAGNOSIS — E1151 Type 2 diabetes mellitus with diabetic peripheral angiopathy without gangrene: Secondary | ICD-10-CM | POA: Diagnosis present

## 2024-05-09 DIAGNOSIS — I10 Essential (primary) hypertension: Secondary | ICD-10-CM

## 2024-05-09 DIAGNOSIS — J208 Acute bronchitis due to other specified organisms: Secondary | ICD-10-CM | POA: Diagnosis present

## 2024-05-09 DIAGNOSIS — R350 Frequency of micturition: Secondary | ICD-10-CM | POA: Diagnosis present

## 2024-05-09 DIAGNOSIS — Z833 Family history of diabetes mellitus: Secondary | ICD-10-CM | POA: Diagnosis not present

## 2024-05-09 DIAGNOSIS — T380X5A Adverse effect of glucocorticoids and synthetic analogues, initial encounter: Secondary | ICD-10-CM | POA: Diagnosis present

## 2024-05-09 DIAGNOSIS — Z7951 Long term (current) use of inhaled steroids: Secondary | ICD-10-CM | POA: Diagnosis not present

## 2024-05-09 DIAGNOSIS — J984 Other disorders of lung: Secondary | ICD-10-CM | POA: Diagnosis present

## 2024-05-09 DIAGNOSIS — E1122 Type 2 diabetes mellitus with diabetic chronic kidney disease: Secondary | ICD-10-CM | POA: Diagnosis present

## 2024-05-09 LAB — BASIC METABOLIC PANEL WITH GFR
Anion gap: 9 (ref 5–15)
BUN: 33 mg/dL — ABNORMAL HIGH (ref 8–23)
CO2: 22 mmol/L (ref 22–32)
Calcium: 7.9 mg/dL — ABNORMAL LOW (ref 8.9–10.3)
Chloride: 100 mmol/L (ref 98–111)
Creatinine, Ser: 1.5 mg/dL — ABNORMAL HIGH (ref 0.61–1.24)
GFR, Estimated: 48 mL/min — ABNORMAL LOW (ref >60)
Glucose, Bld: 172 mg/dL — ABNORMAL HIGH (ref 70–99)
Potassium: 4.7 mmol/L (ref 3.5–5.1)
Sodium: 131 mmol/L — ABNORMAL LOW (ref 135–145)

## 2024-05-09 LAB — CBC
HCT: 34.3 % — ABNORMAL LOW (ref 39.0–52.0)
Hemoglobin: 11.8 g/dL — ABNORMAL LOW (ref 13.0–17.0)
MCH: 34.3 pg — ABNORMAL HIGH (ref 26.0–34.0)
MCHC: 34.4 g/dL (ref 30.0–36.0)
MCV: 99.7 fL (ref 80.0–100.0)
Platelets: 226 K/uL (ref 150–400)
RBC: 3.44 MIL/uL — ABNORMAL LOW (ref 4.22–5.81)
RDW: 12.7 % (ref 11.5–15.5)
WBC: 8.1 K/uL (ref 4.0–10.5)
nRBC: 0 % (ref 0.0–0.2)

## 2024-05-09 LAB — LACTIC ACID, PLASMA
Lactic Acid, Venous: 1.4 mmol/L (ref 0.5–1.9)
Lactic Acid, Venous: 4.9 mmol/L (ref 0.5–1.9)

## 2024-05-09 MED ORDER — FOLIC ACID 1 MG PO TABS
1.0000 mg | ORAL_TABLET | Freq: Every day | ORAL | Status: DC
  Administered 2024-05-09 – 2024-05-10 (×2): 1 mg via ORAL
  Filled 2024-05-09 (×2): qty 1

## 2024-05-09 MED ORDER — PREDNISONE 20 MG PO TABS
50.0000 mg | ORAL_TABLET | Freq: Every day | ORAL | Status: DC
  Administered 2024-05-10: 50 mg via ORAL
  Filled 2024-05-09: qty 1

## 2024-05-09 MED ORDER — ADULT MULTIVITAMIN W/MINERALS CH
1.0000 | ORAL_TABLET | Freq: Every day | ORAL | Status: DC
  Administered 2024-05-09 – 2024-05-10 (×2): 1 via ORAL
  Filled 2024-05-09 (×2): qty 1

## 2024-05-09 MED ORDER — THIAMINE HCL 100 MG/ML IJ SOLN: 100.0000 mg | Freq: Every day | INTRAMUSCULAR | Status: DC

## 2024-05-09 MED ORDER — PNEUMOCOCCAL 20-VAL CONJ VACC 0.5 ML IM SUSY: 0.5000 mL | PREFILLED_SYRINGE | INTRAMUSCULAR | Status: DC

## 2024-05-09 MED ORDER — THIAMINE MONONITRATE 100 MG PO TABS
100.0000 mg | ORAL_TABLET | Freq: Every day | ORAL | Status: DC
  Administered 2024-05-09 – 2024-05-10 (×2): 100 mg via ORAL
  Filled 2024-05-09 (×2): qty 1

## 2024-05-09 MED ORDER — IPRATROPIUM-ALBUTEROL 0.5-2.5 (3) MG/3ML IN SOLN
3.0000 mL | Freq: Four times a day (QID) | RESPIRATORY_TRACT | Status: DC
  Administered 2024-05-09 – 2024-05-10 (×4): 3 mL via RESPIRATORY_TRACT
  Filled 2024-05-09 (×4): qty 3

## 2024-05-09 MED ORDER — INFLUENZA VAC SPLIT HIGH-DOSE 0.5 ML IM SUSY: 0.5000 mL | PREFILLED_SYRINGE | INTRAMUSCULAR | Status: DC

## 2024-05-09 MED ORDER — TICAGRELOR 90 MG PO TABS
90.0000 mg | ORAL_TABLET | Freq: Two times a day (BID) | ORAL | Status: DC
  Administered 2024-05-10: 90 mg via ORAL
  Filled 2024-05-09 (×3): qty 1

## 2024-05-09 NOTE — Plan of Care (Signed)

## 2024-05-09 NOTE — Plan of Care (Signed)

## 2024-05-09 NOTE — Care Management Obs Status (Signed)
 MEDICARE OBSERVATION STATUS NOTIFICATION   Patient Details  Name: Jack Henson MRN: [id number] Date of Birth: June 20, 1951   Medicare Observation Status Notification Given:  Yes    Rojelio SHAUNNA Rattler 05/09/2024, 12:34 PM

## 2024-05-09 NOTE — Progress Notes (Signed)
 PROGRESS NOTE    Jack Henson  FMW:[id number] DOB: February 09, 1951 DOA: 05/08/2024 PCP: Lenon Layman ORN, MD  Chief Complaint  Patient presents with   Loss of Consciousness    Hospital Course:  Jack Henson is 73 year old male with history of alcoholic hepatitis, alcoholism, COPD, CAD, diabetes, hypertension, who presents with shortness of breath and presyncopal episode.  On arrival to the ED patient was found to be wheezing but maintaining normal vital signs and labs were mostly unremarkable.  He was admitted for COPD exacerbation.  Subjective: This morning patient reports he is still feeling very short of breath but somewhat improved since yesterday.  He reports he has not had any alcohol in 2 weeks   Objective: Vitals:   05/08/24 2036 05/09/24 0420 05/09/24 0616 05/09/24 0748  BP: 139/76 (!) 157/77 131/76   Pulse: (!) 109 (!) 106 84   Resp: 20 (!) 22 20   Temp: 98.7 F (37.1 C) 97.7 F (36.5 C) 97.8 F (36.6 C)   TempSrc:  Oral Oral   SpO2: 96% 96% 95% 96%  Weight:      Height:        Intake/Output Summary (Last 24 hours) at 05/09/2024 1545 Last data filed at 05/09/2024 1300 Gross per 24 hour  Intake 1274.92 ml  Output 1000 ml  Net 274.92 ml   Filed Weights   05/08/24 1242  Weight: 68 kg    Examination: General exam: Appears calm and comfortable, NAD  Respiratory system: Dyspneic when speaking, tachypneic, diffuse wheezing Cardiovascular system: S1 & S2 heard, RRR.  Gastrointestinal system: Abdomen is nondistended, soft and nontender.  Neuro: Alert and oriented.  Mild tremor in bilateral hands Extremities: Symmetric, expected ROM  Assessment & Plan:  Principal Problem:   COPD exacerbation (HCC) Active Problems:   Diabetes (HCC)   Essential hypertension, benign   COPD (chronic obstructive pulmonary disease) with chronic bronchitis (HCC)   Depression, major, recurrent, in partial remission   Hypercholesterolemia   Aspiration pneumonia (HCC)   AKI  (acute kidney injury)   Nocturnal polyuria   COPD exacerbation - Continue with DuoNebs - Continue with steroid therapy, taper at DC - Respiratory viral panel negative - Chest CT with bilateral bronchial wall thickening and partial opacification of bilateral lower lobe consistent with bronchitis and retained secretions versus aspiration.  No lobar pneumonia - Patient was started on Unasyn on arrival.  He has no WBC, no fever.  Likely some component of pneumonitis and viral bronchitis.  Will discontinue antibiotics and monitor closely.   - Lactic acidosis on arrival likely secondary to respiratory effort, resolved. - Will discuss with pharmacy to review nebulizers.  Patient reports he was previously taking Advair but due to formulary change he can no longer afford it.  Alcohol abuse - Patient reports last drink was 2 weeks ago.  He has been gradually cutting down outpatient - Currently on naltrexone, will continue - Should be out of window for withdrawal but patient appears visibly tremulous and anxious on exam today - Initiate CIWA protocol.  Will not schedule Ativan for now but remain vigilant and monitoring  CKD stage IIIb - Baseline creatinine 1.3-1.5.  Some acute worsening - Avoid nephrotoxic medications - Renally dosed with a creatinine clearance of 38 when needed - Status post IV fluids - Encourage p.o. hydration  Hyperlipidemia - Continue statin  Hypertension - Gradually resume home meds.  Lasix and lisinopril held on admission due to concern of AKI, but patient has CKD and creatinine appears  to be near baseline -Clinically euvolemic at this time monitor volume status closely.  CAD - Continue home meds  Diabetes - August hemoglobin A1c 5.9% - Anticipate hyperglycemia while admitted secondary to steroid therapy.  Sliding scale if needed   DVT prophylaxis: Heparin    Code Status: Full Code Disposition: Inpatient pending clinical resolution.  Hopefully discharge  tomorrow  Consultants:    Procedures:    Antimicrobials:  Anti-infectives (From admission, onward)    Start     Dose/Rate Route Frequency Ordered Stop   05/08/24 1700  Ampicillin-Sulbactam (UNASYN) 3 g in sodium chloride  0.9 % 100 mL IVPB        3 g 200 mL/hr over 30 Minutes Intravenous Every 6 hours 05/08/24 1657         Data Reviewed: I have personally reviewed following labs and imaging studies CBC: Recent Labs  Lab 05/08/24 1249 05/09/24 0236  WBC 9.3 8.1  HGB 14.4 11.8*  HCT 42.2 34.3*  MCV 101.0* 99.7  PLT 289 226   Basic Metabolic Panel: Recent Labs  Lab 05/08/24 1249 05/09/24 0236  NA 134* 131*  K 4.4 4.7  CL 97* 100  CO2 25 22  GLUCOSE 124* 172*  BUN 27* 33*  CREATININE 1.44* 1.50*  CALCIUM  9.2 7.9*   GFR: Estimated Creatinine Clearance: 38.4 mL/min (A) (by C-G formula based on SCr of 1.5 mg/dL (H)). Liver Function Tests: Recent Labs  Lab 05/08/24 1249  AST 26  ALT 20  ALKPHOS 84  BILITOT 0.9  PROT 8.1  ALBUMIN 4.8   CBG: No results for input(s): GLUCAP in the last 168 hours.  Recent Results (from the past 240 hours)  Resp panel by RT-PCR (RSV, Flu A&B, Covid) Anterior Nasal Swab     Status: None   Collection Time: 05/08/24  3:27 PM   Specimen: Anterior Nasal Swab  Result Value Ref Range Status   SARS Coronavirus 2 by RT PCR NEGATIVE NEGATIVE Final    Comment: (NOTE) SARS-CoV-2 target nucleic acids are NOT DETECTED.  The SARS-CoV-2 RNA is generally detectable in upper respiratory specimens during the acute phase of infection. The lowest concentration of SARS-CoV-2 viral copies this assay can detect is 138 copies/mL. A negative result does not preclude SARS-Cov-2 infection and should not be used as the sole basis for treatment or other patient management decisions. A negative result may occur with  improper specimen collection/handling, submission of specimen other than nasopharyngeal swab, presence of viral mutation(s) within  the areas targeted by this assay, and inadequate number of viral copies(<138 copies/mL). A negative result must be combined with clinical observations, patient history, and epidemiological information. The expected result is Negative.  Fact Sheet for Patients:  bloggercourse.com  Fact Sheet for Healthcare Providers:  seriousbroker.it  This test is no t yet approved or cleared by the [place]  FDA and  has been authorized for detection and/or diagnosis of SARS-CoV-2 by FDA under an Emergency Use Authorization (EUA). This EUA will remain  in effect (meaning this test can be used) for the duration of the COVID-19 declaration under Section 564(b)(1) of the Act, 21 U.S.C.section 360bbb-3(b)(1), unless the authorization is terminated  or revoked sooner.       Influenza A by PCR NEGATIVE NEGATIVE Final   Influenza B by PCR NEGATIVE NEGATIVE Final    Comment: (NOTE) The Xpert Xpress SARS-CoV-2/FLU/RSV plus assay is intended as an aid in the diagnosis of influenza from Nasopharyngeal swab specimens and should not be used as a sole basis  for treatment. Nasal washings and aspirates are unacceptable for Xpert Xpress SARS-CoV-2/FLU/RSV testing.  Fact Sheet for Patients: bloggercourse.com  Fact Sheet for Healthcare Providers: seriousbroker.it  This test is not yet approved or cleared by the [place]  FDA and has been authorized for detection and/or diagnosis of SARS-CoV-2 by FDA under an Emergency Use Authorization (EUA). This EUA will remain in effect (meaning this test can be used) for the duration of the COVID-19 declaration under Section 564(b)(1) of the Act, 21 U.S.C. section 360bbb-3(b)(1), unless the authorization is terminated or revoked.     Resp Syncytial Virus by PCR NEGATIVE NEGATIVE Final    Comment: (NOTE) Fact Sheet for  Patients: bloggercourse.com  Fact Sheet for Healthcare Providers: seriousbroker.it  This test is not yet approved or cleared by the [place]  FDA and has been authorized for detection and/or diagnosis of SARS-CoV-2 by FDA under an Emergency Use Authorization (EUA). This EUA will remain in effect (meaning this test can be used) for the duration of the COVID-19 declaration under Section 564(b)(1) of the Act, 21 U.S.C. section 360bbb-3(b)(1), unless the authorization is terminated or revoked.  Performed at [facility], [address] [place], [place] [address]      Radiology Studies: CT ABDOMEN PELVIS W CONTRAST Result Date: 05/08/2024 CLINICAL DATA:  Syncope/presyncope, short of breath, abdominal pain EXAM: CT ANGIOGRAPHY CHEST CT ABDOMEN AND PELVIS WITH CONTRAST TECHNIQUE: Multidetector CT imaging of the chest was performed using the standard protocol during bolus administration of intravenous contrast. Multiplanar CT image reconstructions and MIPs were obtained to evaluate the vascular anatomy. Multidetector CT imaging of the abdomen and pelvis was performed using the standard protocol during bolus administration of intravenous contrast. RADIATION DOSE REDUCTION: This exam was performed according to the departmental dose-optimization program which includes automated exposure control, adjustment of the mA and/or kV according to patient size and/or use of iterative reconstruction technique. CONTRAST:  OMNIPAQUE IOHEXOL 350 MG/ML SOLN COMPARISON:  05/08/2024, 11/04/2020 FINDINGS: CTA CHEST FINDINGS Cardiovascular: This is a technically adequate evaluation of the pulmonary vasculature. No filling defects or pulmonary emboli. The heart is unremarkable without pericardial effusion. No evidence of thoracic aortic aneurysm or dissection. Atherosclerosis of the aorta and coronary vasculature. Mediastinum/Nodes: No enlarged  mediastinal, hilar, or axillary lymph nodes. Thyroid  gland, trachea, and esophagus demonstrate no significant findings. Lungs/Pleura: No acute airspace disease, effusion, or pneumothorax. There is mild bilateral bronchial wall thickening, with segmental areas of opacification within the bilateral lower lobe bronchi consistent with retained secretions or aspiration. Musculoskeletal: No acute or destructive bony abnormalities. There is progressive spondylosis at the T8-9 level, with bony fusion across the disc space. New severe spondylosis at the T7-8 level. Reconstructed images demonstrate no additional findings. Review of the MIP images confirms the above findings. CT ABDOMEN and PELVIS FINDINGS Hepatobiliary: No focal liver abnormality is seen. No gallstones, gallbladder wall thickening, or biliary dilatation. Pancreas: Unremarkable. No pancreatic ductal dilatation or surrounding inflammatory changes. Spleen: Normal in size without focal abnormality. Adrenals/Urinary Tract: Small bilateral renal cortical cysts do not require specific imaging follow-up. No urinary tract calculi or obstructive uropathy. Vascular calcifications of the bilateral renal hila. The adrenals and bladder appear unremarkable. Stomach/Bowel: No bowel obstruction or ileus. Sigmoid diverticulosis without diverticulitis. No bowel wall thickening or inflammatory change. There is a small fat containing hiatal hernia. Vascular/Lymphatic: Aortic atherosclerosis. No enlarged abdominal or pelvic lymph nodes. Reproductive: Prostate is unremarkable. Other: No free fluid or free intraperitoneal gas. No abdominal wall hernia. Musculoskeletal: No acute or  destructive bony abnormalities. Stable severe spondylosis at the L1-2 level. There is marked facet hypertrophy at L5-S1, with grade 1 degenerative anterolisthesis of L5 on S1. Reconstructed images demonstrate no additional findings. Review of the MIP images confirms the above findings. IMPRESSION: Chest:  1. No evidence of pulmonary embolus. 2. Mild bilateral bronchial wall thickening, with partial opacification of bilateral lower lobe segmental bronchi consistent with bronchitis and retained secretions versus aspiration. No lobar pneumonia. 3. Aortic Atherosclerosis (ICD10-I70.0). Coronary artery atherosclerosis. Abdomen/pelvis: 1. No acute intra-abdominal or intrapelvic process. 2. Sigmoid diverticulosis without diverticulitis. 3.  Aortic Atherosclerosis (ICD10-I70.0). Electronically Signed   By: Ozell Daring M.D.   On: 05/08/2024 16:20   CT Angio Chest PE W and/or Wo Contrast Result Date: 05/08/2024 CLINICAL DATA:  Syncope/presyncope, short of breath, abdominal pain EXAM: CT ANGIOGRAPHY CHEST CT ABDOMEN AND PELVIS WITH CONTRAST TECHNIQUE: Multidetector CT imaging of the chest was performed using the standard protocol during bolus administration of intravenous contrast. Multiplanar CT image reconstructions and MIPs were obtained to evaluate the vascular anatomy. Multidetector CT imaging of the abdomen and pelvis was performed using the standard protocol during bolus administration of intravenous contrast. RADIATION DOSE REDUCTION: This exam was performed according to the departmental dose-optimization program which includes automated exposure control, adjustment of the mA and/or kV according to patient size and/or use of iterative reconstruction technique. CONTRAST:  OMNIPAQUE IOHEXOL 350 MG/ML SOLN COMPARISON:  05/08/2024, 11/04/2020 FINDINGS: CTA CHEST FINDINGS Cardiovascular: This is a technically adequate evaluation of the pulmonary vasculature. No filling defects or pulmonary emboli. The heart is unremarkable without pericardial effusion. No evidence of thoracic aortic aneurysm or dissection. Atherosclerosis of the aorta and coronary vasculature. Mediastinum/Nodes: No enlarged mediastinal, hilar, or axillary lymph nodes. Thyroid  gland, trachea, and esophagus demonstrate no significant findings.  Lungs/Pleura: No acute airspace disease, effusion, or pneumothorax. There is mild bilateral bronchial wall thickening, with segmental areas of opacification within the bilateral lower lobe bronchi consistent with retained secretions or aspiration. Musculoskeletal: No acute or destructive bony abnormalities. There is progressive spondylosis at the T8-9 level, with bony fusion across the disc space. New severe spondylosis at the T7-8 level. Reconstructed images demonstrate no additional findings. Review of the MIP images confirms the above findings. CT ABDOMEN and PELVIS FINDINGS Hepatobiliary: No focal liver abnormality is seen. No gallstones, gallbladder wall thickening, or biliary dilatation. Pancreas: Unremarkable. No pancreatic ductal dilatation or surrounding inflammatory changes. Spleen: Normal in size without focal abnormality. Adrenals/Urinary Tract: Small bilateral renal cortical cysts do not require specific imaging follow-up. No urinary tract calculi or obstructive uropathy. Vascular calcifications of the bilateral renal hila. The adrenals and bladder appear unremarkable. Stomach/Bowel: No bowel obstruction or ileus. Sigmoid diverticulosis without diverticulitis. No bowel wall thickening or inflammatory change. There is a small fat containing hiatal hernia. Vascular/Lymphatic: Aortic atherosclerosis. No enlarged abdominal or pelvic lymph nodes. Reproductive: Prostate is unremarkable. Other: No free fluid or free intraperitoneal gas. No abdominal wall hernia. Musculoskeletal: No acute or destructive bony abnormalities. Stable severe spondylosis at the L1-2 level. There is marked facet hypertrophy at L5-S1, with grade 1 degenerative anterolisthesis of L5 on S1. Reconstructed images demonstrate no additional findings. Review of the MIP images confirms the above findings. IMPRESSION: Chest: 1. No evidence of pulmonary embolus. 2. Mild bilateral bronchial wall thickening, with partial opacification of bilateral  lower lobe segmental bronchi consistent with bronchitis and retained secretions versus aspiration. No lobar pneumonia. 3. Aortic Atherosclerosis (ICD10-I70.0). Coronary artery atherosclerosis. Abdomen/pelvis: 1. No acute intra-abdominal or intrapelvic process. 2.  Sigmoid diverticulosis without diverticulitis. 3.  Aortic Atherosclerosis (ICD10-I70.0). Electronically Signed   By: Ozell Daring M.D.   On: 05/08/2024 16:20   CT Head Wo Contrast Result Date: 05/08/2024 EXAM: CT HEAD WITHOUT CONTRAST 05/08/2024 03:43:56 PM TECHNIQUE: CT of the head was performed without the administration of intravenous contrast. Automated exposure control, iterative reconstruction, and/or weight based adjustment of the mA/kV was utilized to reduce the radiation dose to as low as reasonably achievable. COMPARISON: None available. CLINICAL HISTORY: Syncope/presyncope, cerebrovascular cause suspected. FINDINGS: BRAIN AND VENTRICLES: No acute hemorrhage. No evidence of acute infarct. Age-related atrophy. Periventricular and deep white matter hypodensity typical of chronic small vessel ischemia. Remote lacunar infarct in left caudate. No hydrocephalus. No extra-axial collection. No mass effect or midline shift. ORBITS: No acute abnormality. SINUSES: No acute abnormality. SOFT TISSUES AND SKULL: No acute soft tissue abnormality. No skull fracture. Atherosclerosis of skullbase vasculature without hyperdense vessel or abnormal calcification. IMPRESSION: 1. No acute intracranial abnormality. 2. Chronic small vessel ischemic changes with remote left caudate lacunar infarct. 3. Atherosclerosis of the skull base vasculature without hyperdense vessel. Electronically signed by: Franky Stanford MD 05/08/2024 04:04 PM EDT RP Workstation: [id number]   DG Chest 2 View Result Date: 05/08/2024 EXAM: 2 VIEW(S) XRAY OF THE CHEST 05/08/2024 01:07:49 PM COMPARISON: 11/04/2020 CLINICAL HISTORY: shortness of breath FINDINGS: LUNGS AND PLEURA: Right apical  nodular density is noted. No pulmonary edema. No pleural effusion. No pneumothorax. HEART AND MEDIASTINUM: No acute abnormality of the cardiac and mediastinal silhouettes. BONES AND SOFT TISSUES: No acute osseous abnormality. IMPRESSION: 1. No acute process. 2. Right apical nodular density; CT scan of chest is recommended for further evaluation . Electronically signed by: Lynwood Seip MD 05/08/2024 01:41 PM EDT RP Workstation: [id number]    Scheduled Meds:  albuterol  2.5 mg Nebulization Once   amLODipine   5 mg Oral Daily   atorvastatin   20 mg Oral Daily   fluticasone furoate-vilanterol  1 puff Inhalation Daily   heparin   5,000 Units Subcutaneous Q8H   [START ON 05/10/2024] Influenza vac split trivalent PF  0.5 mL Intramuscular Tomorrow-1000   ipratropium-albuterol  3 mL Nebulization QID   pantoprazole  40 mg Oral Daily   [START ON 05/10/2024] pneumococcal 20-valent conjugate vaccine  0.5 mL Intramuscular Tomorrow-1000   Continuous Infusions:  sodium chloride  100 mL/hr at 05/09/24 0544   ampicillin-sulbactam (UNASYN) IV 3 g (05/09/24 1505)     LOS: 0 days  MDM: Patient is high risk for one or more organ failure.  They necessitate ongoing hospitalization for continued IV therapies and subsequent lab monitoring. Total time spent interpreting labs and vitals, reviewing the medical record, coordinating care amongst consultants and care team members, directly assessing and discussing care with the patient and/or family: 55 min  Aubrianna Orchard, DO [facility]  To contact the attending physician between 7A-7P please use Epic Chat. To contact the covering physician during after hours 7P-7A, please review Amion.  05/09/2024, 3:45 PM   *This document has been created with the assistance of dictation software. Please excuse typographical errors. *

## 2024-05-10 ENCOUNTER — Other Ambulatory Visit: Payer: Self-pay

## 2024-05-10 ENCOUNTER — Encounter (INDEPENDENT_AMBULATORY_CARE_PROVIDER_SITE_OTHER): Payer: Self-pay

## 2024-05-10 DIAGNOSIS — J441 Chronic obstructive pulmonary disease with (acute) exacerbation: Secondary | ICD-10-CM | POA: Diagnosis not present

## 2024-05-10 DIAGNOSIS — F3341 Major depressive disorder, recurrent, in partial remission: Secondary | ICD-10-CM | POA: Diagnosis not present

## 2024-05-10 DIAGNOSIS — J4489 Other specified chronic obstructive pulmonary disease: Secondary | ICD-10-CM | POA: Diagnosis not present

## 2024-05-10 DIAGNOSIS — E119 Type 2 diabetes mellitus without complications: Secondary | ICD-10-CM | POA: Diagnosis not present

## 2024-05-10 LAB — COMPREHENSIVE METABOLIC PANEL WITH GFR
ALT: 22 U/L (ref 0–44)
AST: 53 U/L — ABNORMAL HIGH (ref 15–41)
Albumin: 4.2 g/dL (ref 3.5–5.0)
Alkaline Phosphatase: 59 U/L (ref 38–126)
Anion gap: 9 (ref 5–15)
BUN: 25 mg/dL — ABNORMAL HIGH (ref 8–23)
CO2: 22 mmol/L (ref 22–32)
Calcium: 8.4 mg/dL — ABNORMAL LOW (ref 8.9–10.3)
Chloride: 106 mmol/L (ref 98–111)
Creatinine, Ser: 1.38 mg/dL — ABNORMAL HIGH (ref 0.61–1.24)
GFR, Estimated: 53 mL/min — ABNORMAL LOW (ref >60)
Glucose, Bld: 110 mg/dL — ABNORMAL HIGH (ref 70–99)
Potassium: 3.7 mmol/L (ref 3.5–5.1)
Sodium: 137 mmol/L (ref 135–145)
Total Bilirubin: 0.7 mg/dL (ref 0.0–1.2)
Total Protein: 6.8 g/dL (ref 6.5–8.1)

## 2024-05-10 MED ORDER — FLUTICASONE FUROATE-VILANTEROL 200-25 MCG/ACT IN AEPB
1.0000 | INHALATION_SPRAY | Freq: Every day | RESPIRATORY_TRACT | 2 refills | Status: AC
  Filled 2024-05-10: qty 60, 30d supply, fill #0
  Filled 2024-06-03: qty 60, 30d supply, fill #1
  Filled 2024-07-11: qty 60, 30d supply, fill #2

## 2024-05-10 MED ORDER — TAMSULOSIN HCL 0.4 MG PO CAPS
0.4000 mg | ORAL_CAPSULE | Freq: Every day | ORAL | 0 refills | Status: DC
  Filled 2024-05-10: qty 30, 30d supply, fill #0

## 2024-05-10 MED ORDER — PREDNISONE 20 MG PO TABS
ORAL_TABLET | ORAL | 0 refills | Status: AC
  Filled 2024-05-10: qty 7, 6d supply, fill #0

## 2024-05-10 NOTE — TOC Initial Note (Signed)
 Transition of Care [facility]) - Initial/Assessment Note    Patient Details  Name: Jack Henson MRN: [id number] Date of Birth: 1950/09/27  [facility] of [facility]) CM/SW Contact:    Oden Lindaman L Alyce Inscore, LCSW Phone Number: 05/10/2024, 10:45 AM  Clinical Narrative:                  [name] consult received for substance abuse counseling/education. TOC does not provide counseling/education. Resources were added to the AVS for patient to follow-up.        Patient Goals and CMS Choice            Expected Discharge Plan and Services                                              Prior Living Arrangements/Services                       Activities of Daily Living   ADL Screening (condition at time of admission) Independently performs ADLs?: Yes (appropriate for developmental age) Is the patient deaf or have difficulty hearing?: Yes Does the patient have difficulty seeing, even when wearing glasses/contacts?: No Does the patient have difficulty concentrating, remembering, or making decisions?: No  Permission Sought/Granted                  Emotional Assessment              Admission diagnosis:  Syncope and collapse [R55] Shortness of breath [R06.02] Exertional dyspnea [R06.09] COPD exacerbation (HCC) [J44.1] COPD with acute exacerbation (HCC) [J44.1] Patient Active Problem List   Diagnosis Date Noted   COPD exacerbation (HCC) 05/08/2024   Aspiration pneumonia (HCC) 05/08/2024   AKI (acute kidney injury) 05/08/2024   Nocturnal polyuria 05/08/2024   Ambulates with cane 07/29/2018   Dizziness 11/25/2017   S/P drug eluting coronary stent placement 08/26/2017   Stable angina 08/21/2017   Diabetes (HCC) 07/10/2016   Essential hypertension, benign 07/10/2016   Atherosclerosis with limb claudication 07/10/2016   Health care maintenance 12/14/2015   PVD (peripheral vascular disease) 03/31/2014   Alcoholic hepatitis without ascites (HCC) 03/14/2014    Alcoholism (HCC) 03/14/2014   COPD (chronic obstructive pulmonary disease) with chronic bronchitis (HCC) 03/14/2014   Depression, major, recurrent, in partial remission 03/14/2014   Hypercholesterolemia 03/14/2014   OA (osteoarthritis) 03/14/2014   PCP:  Lenon Layman ORN, MD Pharmacy:   [facility] [phone] [place], [place] - [address] AT [facility] [address] [place] [address] [place] [address] Phone: [phone] Fax: [phone]  [facility] - [place], [place] - [address] [place] [address] Phone: [phone] Fax: [phone]  [facility] - [place], [place] - [address] [place] [address] Phone: [phone] Fax: [phone]     Social Drivers of Health (SDOH) Social History: SDOH Screenings   Food Insecurity: No Food Insecurity (05/09/2024)  Housing: Low Risk  (05/09/2024)  Transportation Needs: No Transportation Needs (05/09/2024)  Utilities: Not At Risk (05/09/2024)  Financial Resource Strain: Low Risk  (02/21/2024)   Received from [facility]  Social Connections: Socially Isolated (05/09/2024)  Tobacco Use: Medium Risk (05/08/2024)   SDOH Interventions:     Readmission Risk Interventions     No data to  display

## 2024-05-10 NOTE — Plan of Care (Signed)

## 2024-05-10 NOTE — Discharge Summary (Signed)
 DISCHARGE SUMMARY    Jack Henson FMW:[id number] DOB: Feb 07, 1951 DOA: 05/08/2024  PCP: Lenon Layman ORN, MD  Admit date: 05/08/2024 Discharge date: 05/10/2024   Recommendations for Outpatient Follow-up:  Follow up with PCP in 1-2 weeks for chronic condition management and prostate evaluation Follow-up with pulmonology to discuss COPD maintenance medications  Hospital Course: HERSEL MCMEEN is 73 year old male with history of alcoholic hepatitis, alcoholism, COPD, CAD, diabetes, hypertension, who presents with shortness of breath and presyncopal episode.  On arrival to the ED patient was found to be wheezing but maintaining normal vital signs and labs were mostly unremarkable.  He was admitted for COPD exacerbation. By reevaluation on 11/2 patient maintained O2 sats on room air.  He endorsed improvement in his dyspnea and wheezing though does report he wheezes at baseline. Patient has been having difficulty obtaining his Advair outpatient as it is no longer on his insurance formulary and has become cost prohibitive.  After discussion with clinical pharmacy it appears that Breo Ellipta will be more affordable.  This was prescribed at discharge. Patient has upcoming appointment with pulmonology.  On day of discharge patient reports feeling ready for DC home.  Care plan also discussed with his daughter, Olam, on the phone.  COPD exacerbation - On room air - Steroid taper at DC - RVP negative - Chest CT with bilateral bronchial wall thickening and partial opacification of bilateral lower lobe consistent with bronchitis and retained secretions versus aspiration.  No lobar pneumonia - Patient was started on Unasyn on arrival.  He has no WBC, no fever.  Likely some component of pneumonitis and viral bronchitis.  Biotics were discontinued at 11/1.  Patient has had no fever and no worsening of symptoms. - Lactic acidosis on arrival likely secondary to respiratory effort, resolved. - Advair  is cost prohibitive.  Breo more affordable (<$50/month), prescribed at DC.   Alcohol abuse - Patient reports last drink was 2 weeks ago.  He has been gradually cutting down outpatient - Currently on naltrexone, will continue - Was on CIWA protocol while admitted but did not appear to withdrawal.  Does have baseline tremor.   CKD stage IIIb - Baseline creatinine 1.3-1.5.  Some acute worsening (AKI ruled out) with improvement to baseline prior to discharge - Avoid nephrotoxic medications - Continue close outpatient follow-up   Hyperlipidemia - Continue statin   Hypertension - Resume home meds.  Lasix and lisinopril held on admission due to concern of AKI, but patient has CKD and creatinine appears to be near baseline. -Clinically euvolemic at this time  CAD - Continue home meds  PAD - Patient follows with vascular.  Please note patient reports he has not taking Brilinta  in a very long time though it was reported as a home med.  Fill history supports that the patient is not currently taking this.  He believes he was taken off of it sometime ago due to his history of alcohol abuse and high risk of bleeding. - Encourage patient to follow-up with his cardiologist and [facility] for discussion regarding antiplatelet therapy   Diabetes - August hemoglobin A1c 5.9%  Polyuria - UA not indicative of infection.  Diabetes well-controlled - Patient reports frequent urination at night. - Prostate unremarkable on CTAP - Initiate Flomax but encouraged patient to follow-up with PCP for further testing and evaluation.  Also discussed this with his daughter.   Discharge Instructions  Discharge Instructions     Ambulatory Referral for Lung Cancer Scre   Complete by: As  directed    Call MD for:  difficulty breathing, headache or visual disturbances   Complete by: As directed    Call MD for:  persistant dizziness or light-headedness   Complete by: As directed    Call MD for:  persistant nausea and  vomiting   Complete by: As directed    Call MD for:  severe uncontrolled pain   Complete by: As directed    Call MD for:  temperature >100.4   Complete by: As directed    Diet general   Complete by: As directed    Discharge instructions   Complete by: As directed    Please follow-up with your primary care physician to discuss your concerns with frequent urination.  Please follow-up with pulmonology for ongoing COPD management   Increase activity slowly   Complete by: As directed       Allergies as of 05/10/2024       Reactions   Aspirin  Other (See Comments)   Hepatitis and alcohol abuse        Medication List     STOP taking these medications    fluticasone-salmeterol 100-50 MCG/ACT Aepb Commonly known as: ADVAIR   ticagrelor  90 MG Tabs tablet Commonly known as: BRILINTA        TAKE these medications    albuterol 108 (90 Base) MCG/ACT inhaler Commonly known as: VENTOLIN HFA Inhale into the lungs.   amLODipine  10 MG tablet Commonly known as: NORVASC  Take 10 mg by mouth daily.   amLODipine  5 MG tablet Commonly known as: NORVASC    aspirin  EC 81 MG tablet Take 81 mg by mouth daily.   atorvastatin  20 MG tablet Commonly known as: LIPITOR Take 1 tablet by mouth daily.   BC FAST PAIN RELIEF PO Take 1 each by mouth.   Breo Ellipta 200-25 MCG/ACT Aepb Generic drug: fluticasone furoate-vilanterol Inhale 1 puff into the lungs daily.   busPIRone 15 MG tablet Commonly known as: BUSPAR Take 15 mg by mouth 2 (two) times daily.   furosemide 40 MG tablet Commonly known as: LASIX   losartan  100 MG tablet Commonly known as: COZAAR  Take 1 tablet by mouth daily. What changed: Another medication with the same name was removed. Continue taking this medication, and follow the directions you see here.   naltrexone 50 MG tablet Commonly known as: DEPADE Take 50 mg by mouth daily.   potassium chloride 10 MEQ tablet Commonly known as: KLOR-CON   predniSONE 20 MG  tablet Commonly known as: DELTASONE Take 2 tablets (40 mg total) by mouth daily with breakfast for 2 days, THEN 1 tablet (20 mg total) daily with breakfast for 2 days, THEN 0.5 tablets (10 mg total) daily with breakfast for 2 days. Start taking on: May 10, 2024   sertraline  100 MG tablet Commonly known as: ZOLOFT  What changed: Another medication with the same name was removed. Continue taking this medication, and follow the directions you see here.   tamsulosin 0.4 MG Caps capsule Commonly known as: FLOMAX Take 1 capsule (0.4 mg total) by mouth daily after supper.        Allergies  Allergen Reactions   Aspirin  Other (See Comments)    Hepatitis and alcohol abuse    Consultations:    Procedures/Studies: CT ABDOMEN PELVIS W CONTRAST Result Date: 05/08/2024 CLINICAL DATA:  Syncope/presyncope, short of breath, abdominal pain EXAM: CT ANGIOGRAPHY CHEST CT ABDOMEN AND PELVIS WITH CONTRAST TECHNIQUE: Multidetector CT imaging of the chest was performed using the standard protocol during bolus  administration of intravenous contrast. Multiplanar CT image reconstructions and MIPs were obtained to evaluate the vascular anatomy. Multidetector CT imaging of the abdomen and pelvis was performed using the standard protocol during bolus administration of intravenous contrast. RADIATION DOSE REDUCTION: This exam was performed according to the departmental dose-optimization program which includes automated exposure control, adjustment of the mA and/or kV according to patient size and/or use of iterative reconstruction technique. CONTRAST:  OMNIPAQUE IOHEXOL 350 MG/ML SOLN COMPARISON:  05/08/2024, 11/04/2020 FINDINGS: CTA CHEST FINDINGS Cardiovascular: This is a technically adequate evaluation of the pulmonary vasculature. No filling defects or pulmonary emboli. The heart is unremarkable without pericardial effusion. No evidence of thoracic aortic aneurysm or dissection. Atherosclerosis of the  aorta and coronary vasculature. Mediastinum/Nodes: No enlarged mediastinal, hilar, or axillary lymph nodes. Thyroid  gland, trachea, and esophagus demonstrate no significant findings. Lungs/Pleura: No acute airspace disease, effusion, or pneumothorax. There is mild bilateral bronchial wall thickening, with segmental areas of opacification within the bilateral lower lobe bronchi consistent with retained secretions or aspiration. Musculoskeletal: No acute or destructive bony abnormalities. There is progressive spondylosis at the T8-9 level, with bony fusion across the disc space. New severe spondylosis at the T7-8 level. Reconstructed images demonstrate no additional findings. Review of the MIP images confirms the above findings. CT ABDOMEN and PELVIS FINDINGS Hepatobiliary: No focal liver abnormality is seen. No gallstones, gallbladder wall thickening, or biliary dilatation. Pancreas: Unremarkable. No pancreatic ductal dilatation or surrounding inflammatory changes. Spleen: Normal in size without focal abnormality. Adrenals/Urinary Tract: Small bilateral renal cortical cysts do not require specific imaging follow-up. No urinary tract calculi or obstructive uropathy. Vascular calcifications of the bilateral renal hila. The adrenals and bladder appear unremarkable. Stomach/Bowel: No bowel obstruction or ileus. Sigmoid diverticulosis without diverticulitis. No bowel wall thickening or inflammatory change. There is a small fat containing hiatal hernia. Vascular/Lymphatic: Aortic atherosclerosis. No enlarged abdominal or pelvic lymph nodes. Reproductive: Prostate is unremarkable. Other: No free fluid or free intraperitoneal gas. No abdominal wall hernia. Musculoskeletal: No acute or destructive bony abnormalities. Stable severe spondylosis at the L1-2 level. There is marked facet hypertrophy at L5-S1, with grade 1 degenerative anterolisthesis of L5 on S1. Reconstructed images demonstrate no additional findings. Review of  the MIP images confirms the above findings. IMPRESSION: Chest: 1. No evidence of pulmonary embolus. 2. Mild bilateral bronchial wall thickening, with partial opacification of bilateral lower lobe segmental bronchi consistent with bronchitis and retained secretions versus aspiration. No lobar pneumonia. 3. Aortic Atherosclerosis (ICD10-I70.0). Coronary artery atherosclerosis. Abdomen/pelvis: 1. No acute intra-abdominal or intrapelvic process. 2. Sigmoid diverticulosis without diverticulitis. 3.  Aortic Atherosclerosis (ICD10-I70.0). Electronically Signed   By: Ozell Daring M.D.   On: 05/08/2024 16:20   CT Angio Chest PE W and/or Wo Contrast Result Date: 05/08/2024 CLINICAL DATA:  Syncope/presyncope, short of breath, abdominal pain EXAM: CT ANGIOGRAPHY CHEST CT ABDOMEN AND PELVIS WITH CONTRAST TECHNIQUE: Multidetector CT imaging of the chest was performed using the standard protocol during bolus administration of intravenous contrast. Multiplanar CT image reconstructions and MIPs were obtained to evaluate the vascular anatomy. Multidetector CT imaging of the abdomen and pelvis was performed using the standard protocol during bolus administration of intravenous contrast. RADIATION DOSE REDUCTION: This exam was performed according to the departmental dose-optimization program which includes automated exposure control, adjustment of the mA and/or kV according to patient size and/or use of iterative reconstruction technique. CONTRAST:  OMNIPAQUE IOHEXOL 350 MG/ML SOLN COMPARISON:  05/08/2024, 11/04/2020 FINDINGS: CTA CHEST FINDINGS Cardiovascular: This is a technically  adequate evaluation of the pulmonary vasculature. No filling defects or pulmonary emboli. The heart is unremarkable without pericardial effusion. No evidence of thoracic aortic aneurysm or dissection. Atherosclerosis of the aorta and coronary vasculature. Mediastinum/Nodes: No enlarged mediastinal, hilar, or axillary lymph nodes. Thyroid  gland,  trachea, and esophagus demonstrate no significant findings. Lungs/Pleura: No acute airspace disease, effusion, or pneumothorax. There is mild bilateral bronchial wall thickening, with segmental areas of opacification within the bilateral lower lobe bronchi consistent with retained secretions or aspiration. Musculoskeletal: No acute or destructive bony abnormalities. There is progressive spondylosis at the T8-9 level, with bony fusion across the disc space. New severe spondylosis at the T7-8 level. Reconstructed images demonstrate no additional findings. Review of the MIP images confirms the above findings. CT ABDOMEN and PELVIS FINDINGS Hepatobiliary: No focal liver abnormality is seen. No gallstones, gallbladder wall thickening, or biliary dilatation. Pancreas: Unremarkable. No pancreatic ductal dilatation or surrounding inflammatory changes. Spleen: Normal in size without focal abnormality. Adrenals/Urinary Tract: Small bilateral renal cortical cysts do not require specific imaging follow-up. No urinary tract calculi or obstructive uropathy. Vascular calcifications of the bilateral renal hila. The adrenals and bladder appear unremarkable. Stomach/Bowel: No bowel obstruction or ileus. Sigmoid diverticulosis without diverticulitis. No bowel wall thickening or inflammatory change. There is a small fat containing hiatal hernia. Vascular/Lymphatic: Aortic atherosclerosis. No enlarged abdominal or pelvic lymph nodes. Reproductive: Prostate is unremarkable. Other: No free fluid or free intraperitoneal gas. No abdominal wall hernia. Musculoskeletal: No acute or destructive bony abnormalities. Stable severe spondylosis at the L1-2 level. There is marked facet hypertrophy at L5-S1, with grade 1 degenerative anterolisthesis of L5 on S1. Reconstructed images demonstrate no additional findings. Review of the MIP images confirms the above findings. IMPRESSION: Chest: 1. No evidence of pulmonary embolus. 2. Mild bilateral  bronchial wall thickening, with partial opacification of bilateral lower lobe segmental bronchi consistent with bronchitis and retained secretions versus aspiration. No lobar pneumonia. 3. Aortic Atherosclerosis (ICD10-I70.0). Coronary artery atherosclerosis. Abdomen/pelvis: 1. No acute intra-abdominal or intrapelvic process. 2. Sigmoid diverticulosis without diverticulitis. 3.  Aortic Atherosclerosis (ICD10-I70.0). Electronically Signed   By: Ozell Daring M.D.   On: 05/08/2024 16:20   CT Head Wo Contrast Result Date: 05/08/2024 EXAM: CT HEAD WITHOUT CONTRAST 05/08/2024 03:43:56 PM TECHNIQUE: CT of the head was performed without the administration of intravenous contrast. Automated exposure control, iterative reconstruction, and/or weight based adjustment of the mA/kV was utilized to reduce the radiation dose to as low as reasonably achievable. COMPARISON: None available. CLINICAL HISTORY: Syncope/presyncope, cerebrovascular cause suspected. FINDINGS: BRAIN AND VENTRICLES: No acute hemorrhage. No evidence of acute infarct. Age-related atrophy. Periventricular and deep white matter hypodensity typical of chronic small vessel ischemia. Remote lacunar infarct in left caudate. No hydrocephalus. No extra-axial collection. No mass effect or midline shift. ORBITS: No acute abnormality. SINUSES: No acute abnormality. SOFT TISSUES AND SKULL: No acute soft tissue abnormality. No skull fracture. Atherosclerosis of skullbase vasculature without hyperdense vessel or abnormal calcification. IMPRESSION: 1. No acute intracranial abnormality. 2. Chronic small vessel ischemic changes with remote left caudate lacunar infarct. 3. Atherosclerosis of the skull base vasculature without hyperdense vessel. Electronically signed by: Franky Stanford MD 05/08/2024 04:04 PM EDT RP Workstation: [id number]   DG Chest 2 View Result Date: 05/08/2024 EXAM: 2 VIEW(S) XRAY OF THE CHEST 05/08/2024 01:07:49 PM COMPARISON: 11/04/2020 CLINICAL  HISTORY: shortness of breath FINDINGS: LUNGS AND PLEURA: Right apical nodular density is noted. No pulmonary edema. No pleural effusion. No pneumothorax. HEART AND MEDIASTINUM: No acute abnormality of the  cardiac and mediastinal silhouettes. BONES AND SOFT TISSUES: No acute osseous abnormality. IMPRESSION: 1. No acute process. 2. Right apical nodular density; CT scan of chest is recommended for further evaluation . Electronically signed by: Lynwood Seip MD 05/08/2024 01:41 PM EDT RP Workstation: [id number]      Discharge Exam: Vitals:   05/10/24 0951 05/10/24 1351  BP: 139/71   Pulse: 93   Resp: 18   Temp: 98.6 F (37 C)   SpO2: 95% (P) 96%   Vitals:   05/10/24 0450 05/10/24 0714 05/10/24 0951 05/10/24 1351  BP: 130/86  139/71   Pulse: 84  93   Resp: 16  18   Temp: 98.4 F (36.9 C)  98.6 F (37 C)   TempSrc:   Oral   SpO2: 97% 94% 95% (P) 96%  Weight:      Height:        Constitutional:  Normal appearance. Non toxic-appearing.  HENT: Head Normocephalic and atraumatic.  Mucous membranes are moist.  Eyes:  Extraocular intact. Conjunctivae normal.  Cardiovascular: Rate and Rhythm: Normal rate and regular rhythm.  Pulmonary: Non labored, symmetric rise of chest wall.  End expiratory wheeze bilaterally Skin: warm and dry. not jaundiced.  Neurological: No focal deficit present. alert. Oriented.  Psychiatric: Mood and Affect congruent.    The results of significant diagnostics from this hospitalization (including imaging, microbiology, ancillary and laboratory) are listed below for reference.     Microbiology: Recent Results (from the past 240 hours)  Resp panel by RT-PCR (RSV, Flu A&B, Covid) Anterior Nasal Swab     Status: None   Collection Time: 05/08/24  3:27 PM   Specimen: Anterior Nasal Swab  Result Value Ref Range Status   SARS Coronavirus 2 by RT PCR NEGATIVE NEGATIVE Final    Comment: (NOTE) SARS-CoV-2 target nucleic acids are NOT DETECTED.  The SARS-CoV-2 RNA is  generally detectable in upper respiratory specimens during the acute phase of infection. The lowest concentration of SARS-CoV-2 viral copies this assay can detect is 138 copies/mL. A negative result does not preclude SARS-Cov-2 infection and should not be used as the sole basis for treatment or other patient management decisions. A negative result may occur with  improper specimen collection/handling, submission of specimen other than nasopharyngeal swab, presence of viral mutation(s) within the areas targeted by this assay, and inadequate number of viral copies(<138 copies/mL). A negative result must be combined with clinical observations, patient history, and epidemiological information. The expected result is Negative.  Fact Sheet for Patients:  bloggercourse.com  Fact Sheet for Healthcare Providers:  seriousbroker.it  This test is no t yet approved or cleared by the [place]  FDA and  has been authorized for detection and/or diagnosis of SARS-CoV-2 by FDA under an Emergency Use Authorization (EUA). This EUA will remain  in effect (meaning this test can be used) for the duration of the COVID-19 declaration under Section 564(b)(1) of the Act, 21 U.S.C.section 360bbb-3(b)(1), unless the authorization is terminated  or revoked sooner.       Influenza A by PCR NEGATIVE NEGATIVE Final   Influenza B by PCR NEGATIVE NEGATIVE Final    Comment: (NOTE) The Xpert Xpress SARS-CoV-2/FLU/RSV plus assay is intended as an aid in the diagnosis of influenza from Nasopharyngeal swab specimens and should not be used as a sole basis for treatment. Nasal washings and aspirates are unacceptable for Xpert Xpress SARS-CoV-2/FLU/RSV testing.  Fact Sheet for Patients: bloggercourse.com  Fact Sheet for Healthcare Providers: seriousbroker.it  This test is  not yet approved or cleared by the [place]  [place] FDA and has been authorized for detection and/or diagnosis of SARS-CoV-2 by FDA under an Emergency Use Authorization (EUA). This EUA will remain in effect (meaning this test can be used) for the duration of the COVID-19 declaration under Section 564(b)(1) of the Act, 21 U.S.C. section 360bbb-3(b)(1), unless the authorization is terminated or revoked.     Resp Syncytial Virus by PCR NEGATIVE NEGATIVE Final    Comment: (NOTE) Fact Sheet for Patients: bloggercourse.com  Fact Sheet for Healthcare Providers: seriousbroker.it  This test is not yet approved or cleared by the [place]  FDA and has been authorized for detection and/or diagnosis of SARS-CoV-2 by FDA under an Emergency Use Authorization (EUA). This EUA will remain in effect (meaning this test can be used) for the duration of the COVID-19 declaration under Section 564(b)(1) of the Act, 21 U.S.C. section 360bbb-3(b)(1), unless the authorization is terminated or revoked.  Performed at [facility], [address] [place], [place] [address]      Labs: BNP (last 3 results) Recent Labs    05/08/24 1249  BNP 53.3   Basic Metabolic Panel: Recent Labs  Lab 05/08/24 1249 05/09/24 0236 05/10/24 1003  NA 134* 131* 137  K 4.4 4.7 3.7  CL 97* 100 106  CO2 25 22 22   GLUCOSE 124* 172* 110*  BUN 27* 33* 25*  CREATININE 1.44* 1.50* 1.38*  CALCIUM  9.2 7.9* 8.4*   Liver Function Tests: Recent Labs  Lab 05/08/24 1249 05/10/24 1003  AST 26 53*  ALT 20 22  ALKPHOS 84 59  BILITOT 0.9 0.7  PROT 8.1 6.8  ALBUMIN 4.8 4.2   Recent Labs  Lab 05/08/24 1452  LIPASE 53*   No results for input(s): AMMONIA in the last 168 hours. CBC: Recent Labs  Lab 05/08/24 1249 05/09/24 0236  WBC 9.3 8.1  HGB 14.4 11.8*  HCT 42.2 34.3*  MCV 101.0* 99.7  PLT 289 226   Cardiac Enzymes: No results for input(s): CKTOTAL, CKMB, CKMBINDEX, TROPONINI in  the last 168 hours. BNP: Invalid input(s): POCBNP CBG: No results for input(s): GLUCAP in the last 168 hours. D-Dimer No results for input(s): DDIMER in the last 72 hours. Hgb A1c No results for input(s): HGBA1C in the last 72 hours. Lipid Profile No results for input(s): CHOL, HDL, LDLCALC, TRIG, CHOLHDL, LDLDIRECT in the last 72 hours. Thyroid  function studies No results for input(s): TSH, T4TOTAL, T3FREE, THYROIDAB in the last 72 hours.  Invalid input(s): FREET3 Anemia work up No results for input(s): VITAMINB12, FOLATE, FERRITIN, TIBC, IRON, RETICCTPCT in the last 72 hours. Urinalysis    Component Value Date/Time   COLORURINE YELLOW (A) 05/08/2024 1624   APPEARANCEUR CLEAR (A) 05/08/2024 1624   LABSPEC 1.024 05/08/2024 1624   PHURINE 7.0 05/08/2024 1624   GLUCOSEU NEGATIVE 05/08/2024 1624   HGBUR NEGATIVE 05/08/2024 1624   BILIRUBINUR NEGATIVE 05/08/2024 1624   KETONESUR NEGATIVE 05/08/2024 1624   PROTEINUR NEGATIVE 05/08/2024 1624   NITRITE NEGATIVE 05/08/2024 1624   LEUKOCYTESUR NEGATIVE 05/08/2024 1624   Sepsis Labs Recent Labs  Lab 05/08/24 1249 05/09/24 0236  WBC 9.3 8.1   Microbiology Recent Results (from the past 240 hours)  Resp panel by RT-PCR (RSV, Flu A&B, Covid) Anterior Nasal Swab     Status: None   Collection Time: 05/08/24  3:27 PM   Specimen: Anterior Nasal Swab  Result Value Ref Range Status   SARS Coronavirus 2 by RT PCR NEGATIVE NEGATIVE Final  Comment: (NOTE) SARS-CoV-2 target nucleic acids are NOT DETECTED.  The SARS-CoV-2 RNA is generally detectable in upper respiratory specimens during the acute phase of infection. The lowest concentration of SARS-CoV-2 viral copies this assay can detect is 138 copies/mL. A negative result does not preclude SARS-Cov-2 infection and should not be used as the sole basis for treatment or other patient management decisions. A negative result may occur with   improper specimen collection/handling, submission of specimen other than nasopharyngeal swab, presence of viral mutation(s) within the areas targeted by this assay, and inadequate number of viral copies(<138 copies/mL). A negative result must be combined with clinical observations, patient history, and epidemiological information. The expected result is Negative.  Fact Sheet for Patients:  bloggercourse.com  Fact Sheet for Healthcare Providers:  seriousbroker.it  This test is no t yet approved or cleared by the [place]  FDA and  has been authorized for detection and/or diagnosis of SARS-CoV-2 by FDA under an Emergency Use Authorization (EUA). This EUA will remain  in effect (meaning this test can be used) for the duration of the COVID-19 declaration under Section 564(b)(1) of the Act, 21 U.S.C.section 360bbb-3(b)(1), unless the authorization is terminated  or revoked sooner.       Influenza A by PCR NEGATIVE NEGATIVE Final   Influenza B by PCR NEGATIVE NEGATIVE Final    Comment: (NOTE) The Xpert Xpress SARS-CoV-2/FLU/RSV plus assay is intended as an aid in the diagnosis of influenza from Nasopharyngeal swab specimens and should not be used as a sole basis for treatment. Nasal washings and aspirates are unacceptable for Xpert Xpress SARS-CoV-2/FLU/RSV testing.  Fact Sheet for Patients: bloggercourse.com  Fact Sheet for Healthcare Providers: seriousbroker.it  This test is not yet approved or cleared by the [place]  FDA and has been authorized for detection and/or diagnosis of SARS-CoV-2 by FDA under an Emergency Use Authorization (EUA). This EUA will remain in effect (meaning this test can be used) for the duration of the COVID-19 declaration under Section 564(b)(1) of the Act, 21 U.S.C. section 360bbb-3(b)(1), unless the authorization is terminated or revoked.      Resp Syncytial Virus by PCR NEGATIVE NEGATIVE Final    Comment: (NOTE) Fact Sheet for Patients: bloggercourse.com  Fact Sheet for Healthcare Providers: seriousbroker.it  This test is not yet approved or cleared by the [place]  FDA and has been authorized for detection and/or diagnosis of SARS-CoV-2 by FDA under an Emergency Use Authorization (EUA). This EUA will remain in effect (meaning this test can be used) for the duration of the COVID-19 declaration under Section 564(b)(1) of the Act, 21 U.S.C. section 360bbb-3(b)(1), unless the authorization is terminated or revoked.  Performed at [facility], [address], [place], [place] [address]      Time coordinating discharge: 32 min  SIGNED: Lorane Poland, DO [facility] 05/10/2024, 2:17 PM Pager   If 7PM-7AM, please contact night-coverage

## 2024-05-10 NOTE — Discharge Instructions (Signed)

## 2024-05-10 NOTE — Plan of Care (Signed)

## 2024-05-14 DIAGNOSIS — Z125 Encounter for screening for malignant neoplasm of prostate: Secondary | ICD-10-CM | POA: Diagnosis not present

## 2024-05-14 DIAGNOSIS — K7010 Alcoholic hepatitis without ascites: Secondary | ICD-10-CM | POA: Diagnosis not present

## 2024-05-14 DIAGNOSIS — Z23 Encounter for immunization: Secondary | ICD-10-CM | POA: Diagnosis not present

## 2024-05-14 DIAGNOSIS — J4489 Other specified chronic obstructive pulmonary disease: Secondary | ICD-10-CM | POA: Diagnosis not present

## 2024-05-14 DIAGNOSIS — F1020 Alcohol dependence, uncomplicated: Secondary | ICD-10-CM | POA: Diagnosis not present

## 2024-05-14 NOTE — Progress Notes (Signed)
 Discharge Summaries - documented in this encounter  Leesa Kast, DO - 05/10/2024 2:17 PM EST Formatting of this note is different from the original. Images from the original note were not included.  DISCHARGE SUMMARY  Jack Henson FMW:[id number] DOB: 1950/12/06 DOA: 05/08/2024  PCP: Lenon Layman ORN, MD  Admit date: 05/08/2024 Discharge date: 05/10/2024  Recommendations for Outpatient Follow-up: Follow up with PCP in 1-2 weeks for chronic condition management and prostate evaluation Follow-up with pulmonology to discuss COPD maintenance medications  Hospital Course: Jack Henson is 73 year old male with history of alcoholic hepatitis, alcoholism, COPD, CAD, diabetes, hypertension, who presents with shortness of breath and presyncopal episode. On arrival to the ED patient was found to be wheezing but maintaining normal vital signs and labs were mostly unremarkable. He was admitted for COPD exacerbation. By reevaluation on 11/2 patient maintained O2 sats on room air. He endorsed improvement in his dyspnea and wheezing though does report he wheezes at baseline. Patient has been having difficulty obtaining his Advair outpatient as it is no longer on his insurance formulary and has become cost prohibitive. After discussion with clinical pharmacy it appears that Breo Ellipta will be more affordable. This was prescribed at discharge. Patient has upcoming appointment with pulmonology.  On day of discharge patient reports feeling ready for DC home. Care plan also discussed with his daughter, Olam, on the phone.  COPD exacerbation - On room air - Steroid taper at DC - RVP negative - Chest CT with bilateral bronchial wall thickening and partial opacification of bilateral lower lobe consistent with bronchitis and retained secretions versus aspiration. No lobar pneumonia - Patient was started on Unasyn on arrival. He has no WBC, no fever. Likely some component of pneumonitis and viral  bronchitis. Biotics were discontinued at 11/1. Patient has had no fever and no worsening of symptoms. - Lactic acidosis on arrival likely secondary to respiratory effort, resolved. - Advair is cost prohibitive. Breo more affordable (<$50/month), prescribed at DC.  Alcohol abuse - Patient reports last drink was 2 weeks ago. He has been gradually cutting down outpatient - Currently on naltrexone, will continue - Was on CIWA protocol while admitted but did not appear to withdrawal. Does have baseline tremor.  CKD stage IIIb - Baseline creatinine 1.3-1.5. Some acute worsening (AKI ruled out) with improvement to baseline prior to discharge - Avoid nephrotoxic medications - Continue close outpatient follow-up  Hyperlipidemia - Continue statin  Hypertension - Resume home meds. Lasix and lisinopril held on admission due to concern of AKI, but patient has CKD and creatinine appears to be near baseline. -Clinically euvolemic at this time  CAD - Continue home meds  PAD - Patient follows with vascular. Please note patient reports he has not taking Brilinta  in a very long time though it was reported as a home med. Fill history supports that the patient is not currently taking this. He believes he was taken off of it sometime ago due to his history of alcohol abuse and high risk of bleeding. - Encourage patient to follow-up with his cardiologist and [facility] for discussion regarding antiplatelet therapy  Diabetes - August hemoglobin A1c 5.9%  Polyuria - UA not indicative of infection. Diabetes well-controlled - Patient reports frequent urination at night. - Prostate unremarkable on CTAP - Initiate Flomax but encouraged patient to follow-up with PCP for further testing and evaluation. Also discussed this with his daughter.  Discharge Instructions  Discharge Instructions  Ambulatory Referral for Lung Cancer Scre Complete by: As directed  Call MD for: difficulty breathing, headache or visual  disturbances Complete by: As directed  Call MD for: persistant dizziness or light-headedness Complete by: As directed  Call MD for: persistant nausea and vomiting Complete by: As directed  Call MD for: severe uncontrolled pain Complete by: As directed  Call MD for: temperature >100.4 Complete by: As directed  Diet general Complete by: As directed  Discharge instructions Complete by: As directed  Please follow-up with your primary care physician to discuss your concerns with frequent urination. Please follow-up with pulmonology for ongoing COPD management Increase activity slowly Complete by: As directed     Allergies as of 05/10/2024  Reactions Aspirin  Other (See Comments) Hepatitis and alcohol abuse     Medication List   STOP taking these medications  fluticasone-salmeterol 100-50 MCG/ACT Aepb Commonly known as: ADVAIR  ticagrelor  90 MG Tabs tablet Commonly known as: BRILINTA      TAKE these medications  albuterol 108 (90 Base) MCG/ACT inhaler Commonly known as: VENTOLIN HFA Inhale into the lungs.  amLODipine  10 MG tablet Commonly known as: NORVASC  Take 10 mg by mouth daily.  amLODipine  5 MG tablet Commonly known as: NORVASC   aspirin  EC 81 MG tablet Take 81 mg by mouth daily.  atorvastatin  20 MG tablet Commonly known as: LIPITOR Take 1 tablet by mouth daily.  BC FAST PAIN RELIEF PO Take 1 each by mouth.  Breo Ellipta 200-25 MCG/ACT Aepb Generic drug: fluticasone furoate-vilanterol Inhale 1 puff into the lungs daily.  busPIRone 15 MG tablet Commonly known as: BUSPAR Take 15 mg by mouth 2 (two) times daily.  furosemide 40 MG tablet Commonly known as: LASIX  losartan  100 MG tablet Commonly known as: COZAAR  Take 1 tablet by mouth daily. What changed: Another medication with the same name was removed. Continue taking this medication, and follow the directions you see here.  naltrexone 50 MG tablet Commonly known as: DEPADE Take 50 mg by  mouth daily.  potassium chloride 10 MEQ tablet Commonly known as: KLOR-CON  predniSONE 20 MG tablet Commonly known as: DELTASONE Take 2 tablets (40 mg total) by mouth daily with breakfast for 2 days, THEN 1 tablet (20 mg total) daily with breakfast for 2 days, THEN 0.5 tablets (10 mg total) daily with breakfast for 2 days. Start taking on: May 10, 2024  sertraline  100 MG tablet Commonly known as: ZOLOFT  What changed: Another medication with the same name was removed. Continue taking this medication, and follow the directions you see here.  tamsulosin 0.4 MG Caps capsule Commonly known as: FLOMAX Take 1 capsule (0.4 mg total) by mouth daily after supper.     Allergies Allergen Reactions Aspirin  Other (See Comments) Hepatitis and alcohol abuse  Consultations:  Procedures/Studies: CT ABDOMEN PELVIS W CONTRAST Result Date: 05/08/2024 CLINICAL DATA: Syncope/presyncope, short of breath, abdominal pain EXAM: CT ANGIOGRAPHY CHEST CT ABDOMEN AND PELVIS WITH CONTRAST TECHNIQUE: Multidetector CT imaging of the chest was performed using the standard protocol during bolus administration of intravenous contrast. Multiplanar CT image reconstructions and MIPs were obtained to evaluate the vascular anatomy. Multidetector CT imaging of the abdomen and pelvis was performed using the standard protocol during bolus administration of intravenous contrast. RADIATION DOSE REDUCTION: This exam was performed according to the departmental dose-optimization program which includes automated exposure control, adjustment of the mA and/or kV according to patient size and/or use of iterative reconstruction technique. CONTRAST: OMNIPAQUE IOHEXOL 350 MG/ML SOLN COMPARISON: 05/08/2024, 11/04/2020 FINDINGS: CTA CHEST FINDINGS Cardiovascular: This is a technically adequate evaluation of  the pulmonary vasculature. No filling defects or pulmonary emboli. The heart is unremarkable without pericardial effusion. No  evidence of thoracic aortic aneurysm or dissection. Atherosclerosis of the aorta and coronary vasculature. Mediastinum/Nodes: No enlarged mediastinal, hilar, or axillary lymph nodes. Thyroid  gland, trachea, and esophagus demonstrate no significant findings. Lungs/Pleura: No acute airspace disease, effusion, or pneumothorax. There is mild bilateral bronchial wall thickening, with segmental areas of opacification within the bilateral lower lobe bronchi consistent with retained secretions or aspiration. Musculoskeletal: No acute or destructive bony abnormalities. There is progressive spondylosis at the T8-9 level, with bony fusion across the disc space. New severe spondylosis at the T7-8 level. Reconstructed images demonstrate no additional findings. Review of the MIP images confirms the above findings. CT ABDOMEN and PELVIS FINDINGS Hepatobiliary: No focal liver abnormality is seen. No gallstones, gallbladder wall thickening, or biliary dilatation. Pancreas: Unremarkable. No pancreatic ductal dilatation or surrounding inflammatory changes. Spleen: Normal in size without focal abnormality. Adrenals/Urinary Tract: Small bilateral renal cortical cysts do not require specific imaging follow-up. No urinary tract calculi or obstructive uropathy. Vascular calcifications of the bilateral renal hila. The adrenals and bladder appear unremarkable. Stomach/Bowel: No bowel obstruction or ileus. Sigmoid diverticulosis without diverticulitis. No bowel wall thickening or inflammatory change. There is a small fat containing hiatal hernia. Vascular/Lymphatic: Aortic atherosclerosis. No enlarged abdominal or pelvic lymph nodes. Reproductive: Prostate is unremarkable. Other: No free fluid or free intraperitoneal gas. No abdominal wall hernia. Musculoskeletal: No acute or destructive bony abnormalities. Stable severe spondylosis at the L1-2 level. There is marked facet hypertrophy at L5-S1, with grade 1 degenerative anterolisthesis of L5  on S1. Reconstructed images demonstrate no additional findings. Review of the MIP images confirms the above findings. IMPRESSION: Chest: 1. No evidence of pulmonary embolus. 2. Mild bilateral bronchial wall thickening, with partial opacification of bilateral lower lobe segmental bronchi consistent with bronchitis and retained secretions versus aspiration. No lobar pneumonia. 3. Aortic Atherosclerosis (ICD10-I70.0). Coronary artery atherosclerosis. Abdomen/pelvis: 1. No acute intra-abdominal or intrapelvic process. 2. Sigmoid diverticulosis without diverticulitis. 3. Aortic Atherosclerosis (ICD10-I70.0). Electronically Signed By: Ozell Daring M.D. On: 05/08/2024 16:20  CT Angio Chest PE W and/or Wo Contrast Result Date: 05/08/2024 CLINICAL DATA: Syncope/presyncope, short of breath, abdominal pain EXAM: CT ANGIOGRAPHY CHEST CT ABDOMEN AND PELVIS WITH CONTRAST TECHNIQUE: Multidetector CT imaging of the chest was performed using the standard protocol during bolus administration of intravenous contrast. Multiplanar CT image reconstructions and MIPs were obtained to evaluate the vascular anatomy. Multidetector CT imaging of the abdomen and pelvis was performed using the standard protocol during bolus administration of intravenous contrast. RADIATION DOSE REDUCTION: This exam was performed according to the departmental dose-optimization program which includes automated exposure control, adjustment of the mA and/or kV according to patient size and/or use of iterative reconstruction technique. CONTRAST: OMNIPAQUE IOHEXOL 350 MG/ML SOLN COMPARISON: 05/08/2024, 11/04/2020 FINDINGS: CTA CHEST FINDINGS Cardiovascular: This is a technically adequate evaluation of the pulmonary vasculature. No filling defects or pulmonary emboli. The heart is unremarkable without pericardial effusion. No evidence of thoracic aortic aneurysm or dissection. Atherosclerosis of the aorta and coronary vasculature. Mediastinum/Nodes: No  enlarged mediastinal, hilar, or axillary lymph nodes. Thyroid  gland, trachea, and esophagus demonstrate no significant findings. Lungs/Pleura: No acute airspace disease, effusion, or pneumothorax. There is mild bilateral bronchial wall thickening, with segmental areas of opacification within the bilateral lower lobe bronchi consistent with retained secretions or aspiration. Musculoskeletal: No acute or destructive bony abnormalities. There is progressive spondylosis at the T8-9 level, with bony fusion across the  disc space. New severe spondylosis at the T7-8 level. Reconstructed images demonstrate no additional findings. Review of the MIP images confirms the above findings. CT ABDOMEN and PELVIS FINDINGS Hepatobiliary: No focal liver abnormality is seen. No gallstones, gallbladder wall thickening, or biliary dilatation. Pancreas: Unremarkable. No pancreatic ductal dilatation or surrounding inflammatory changes. Spleen: Normal in size without focal abnormality. Adrenals/Urinary Tract: Small bilateral renal cortical cysts do not require specific imaging follow-up. No urinary tract calculi or obstructive uropathy. Vascular calcifications of the bilateral renal hila. The adrenals and bladder appear unremarkable. Stomach/Bowel: No bowel obstruction or ileus. Sigmoid diverticulosis without diverticulitis. No bowel wall thickening or inflammatory change. There is a small fat containing hiatal hernia. Vascular/Lymphatic: Aortic atherosclerosis. No enlarged abdominal or pelvic lymph nodes. Reproductive: Prostate is unremarkable. Other: No free fluid or free intraperitoneal gas. No abdominal wall hernia. Musculoskeletal: No acute or destructive bony abnormalities. Stable severe spondylosis at the L1-2 level. There is marked facet hypertrophy at L5-S1, with grade 1 degenerative anterolisthesis of L5 on S1. Reconstructed images demonstrate no additional findings. Review of the MIP images confirms the above findings. IMPRESSION:  Chest: 1. No evidence of pulmonary embolus. 2. Mild bilateral bronchial wall thickening, with partial opacification of bilateral lower lobe segmental bronchi consistent with bronchitis and retained secretions versus aspiration. No lobar pneumonia. 3. Aortic Atherosclerosis (ICD10-I70.0). Coronary artery atherosclerosis. Abdomen/pelvis: 1. No acute intra-abdominal or intrapelvic process. 2. Sigmoid diverticulosis without diverticulitis. 3. Aortic Atherosclerosis (ICD10-I70.0). Electronically Signed By: Ozell Daring M.D. On: 05/08/2024 16:20  CT Head Wo Contrast Result Date: 05/08/2024 EXAM: CT HEAD WITHOUT CONTRAST 05/08/2024 03:43:56 PM TECHNIQUE: CT of the head was performed without the administration of intravenous contrast. Automated exposure control, iterative reconstruction, and/or weight based adjustment of the mA/kV was utilized to reduce the radiation dose to as low as reasonably achievable. COMPARISON: None available. CLINICAL HISTORY: Syncope/presyncope, cerebrovascular cause suspected. FINDINGS: BRAIN AND VENTRICLES: No acute hemorrhage. No evidence of acute infarct. Age-related atrophy. Periventricular and deep white matter hypodensity typical of chronic small vessel ischemia. Remote lacunar infarct in left caudate. No hydrocephalus. No extra-axial collection. No mass effect or midline shift. ORBITS: No acute abnormality. SINUSES: No acute abnormality. SOFT TISSUES AND SKULL: No acute soft tissue abnormality. No skull fracture. Atherosclerosis of skullbase vasculature without hyperdense vessel or abnormal calcification. IMPRESSION: 1. No acute intracranial abnormality. 2. Chronic small vessel ischemic changes with remote left caudate lacunar infarct. 3. Atherosclerosis of the skull base vasculature without hyperdense vessel. Electronically signed by: Franky Stanford MD 05/08/2024 04:04 PM EDT RP Workstation: [id number]  DG Chest 2 View Result Date: 05/08/2024 EXAM: 2 VIEW(S) XRAY OF THE CHEST  05/08/2024 01:07:49 PM COMPARISON: 11/04/2020 CLINICAL HISTORY: shortness of breath FINDINGS: LUNGS AND PLEURA: Right apical nodular density is noted. No pulmonary edema. No pleural effusion. No pneumothorax. HEART AND MEDIASTINUM: No acute abnormality of the cardiac and mediastinal silhouettes. BONES AND SOFT TISSUES: No acute osseous abnormality. IMPRESSION: 1. No acute process. 2. Right apical nodular density; CT scan of chest is recommended for further evaluation . Electronically signed by: Lynwood Seip MD 05/08/2024 01:41 PM EDT RP Workstation: [id number]   Jack Henson is a 73 y.o. male here for follow up of their medical problems  CHIEF COMPLAINT:  Follow up medical problems in the problem list and as discussed in the history and assessment areas as well as new complaints as listed.   Patient Active Problem List  Diagnosis  . OA (osteoarthritis)  . COPD (chronic obstructive pulmonary disease) with chronic bronchitis (CMS/HHS-HCC)  .  Hypercholesterolemia  . Depression, major, recurrent, in partial remission ()  . Alcoholism (CMS/HHS-HCC)  . HTN (hypertension), benign  . Type 2 diabetes mellitus with stage 3a chronic kidney disease, without long-term current use of insulin (CMS/HHS-HCC)  . Alcoholic hepatitis without ascites (CMS/HHS-HCC)  . PVD (peripheral vascular disease)  . Health care maintenance  . Thoracic aortic atherosclerosis  . Coronary artery disease involving native coronary artery of native heart  . Ambulates with cane  . Dependence on crutches     HISTORY OF PRESENT ILLNESS:  COPD (chronic obstructive pulmonary disease) with chronic bronchitis Recent exacerbation noted. Improved, changed inhalers given affordability.   Alcoholism (CMS/HHS-HCC) Stopped drinking several weeks ago, child psychotherapist.   Alcoholic hepatitis without ascites (CMS/HHS-HCC) Hopefully dc in the fall of 25 will hold  Past Medical History:  Diagnosis Date  . Alcoholic hepatitis  (CMS/HHS-HCC)   . BPH (benign prostatic hypertrophy)   . Colon polyps   . COPD (chronic obstructive pulmonary disease) (CMS/HHS-HCC)   . Depression   . Diabetes mellitus type 2, uncomplicated (CMS/HHS-HCC)   . GERD (gastroesophageal reflux disease)   . History of hemorrhoids   . Hyperlipidemia   . Hypertension   . Osteoarthritis   . Peripheral vascular disease ()   . Prostatitis     Past Surgical History:  Procedure Laterality Date  . COLONOSCOPY  09/09/2020   Tubular adenomas/TVA/PHx CP/Repeat 64yrs/TKT  . COLONOSCOPY  06/17/1997, 10/12/2002, 05/13/2009  . COLONOSCOPY    . EGD  10/12/2002, 05/13/2009  . Foot surgery    . PERCUTANEOUS TRANSCATH PLACEMENT INTRAVASCULAR STENT LEG    . POLYPECTOMY    . Right subacromial decompression    . UPPER GASTROINTESTINAL ENDOSCOPY       No fever chills or sweats   No nausea, vomiting or diarrhea  No chest pain, shortness of breath   Social History   Socioeconomic History  . Marital status: Widowed    Spouse name: Romero  . Number of children: 2  . Years of education: GED  Occupational History  . Occupation: Retired  Tobacco Use  . Smoking status: Former    Current packs/day: 0.00    Types: Cigarettes    Quit date: 07/06/2014    Years since quitting: 9.8  . Smokeless tobacco: Never  Vaping Use  . Vaping status: Never Used  Substance and Sexual Activity  . Alcohol use: Not Currently    Comment: Alcoholic- Beer and liquor daily:18-20 beers per day, 1/2 gallon liquor per week  . Sexual activity: Defer    Partners: Female   Social Drivers of Corporate Investment Banker Strain: Low Risk  (02/21/2024)   Overall Financial Resource Strain (CARDIA)   . Difficulty of Paying Living Expenses: Not hard at all  Food Insecurity: No Food Insecurity (05/09/2024)   Received from [facility]   Hunger Vital Sign   . Within the past 12 months, you worried that your food would run out before you got the money to buy more.: Never true   .  Within the past 12 months, the food you bought just didn't last and you didn't have money to get more.: Never true  Transportation Needs: No Transportation Needs (05/09/2024)   Received from [facility] - Transportation   . In the past 12 months, has lack of transportation kept you from medical appointments or from getting medications?: No   . In the past 12 months, has lack of transportation kept you from meetings, work, or from  getting things needed for daily living?: No  Social Connections: Socially Isolated (05/09/2024)   Received from [facility]   Social Connection and Isolation Panel   . In a typical week, how many times do you talk on the phone with family, friends, or neighbors?: More than three times a week   . How often do you get together with friends or relatives?: Twice a week   . How often do you attend church or religious services?: Never   . Do you belong to any clubs or organizations such as church groups, unions, fraternal or athletic groups, or school groups?: No   . How often do you attend meetings of the clubs or organizations you belong to?: Never   . Are you married, widowed, divorced, separated, never married, or living with a partner?: Widowed  Housing Stability: Low Risk  (02/21/2024)   Housing Stability Vital Sign   . Unable to Pay for Housing in the Last Year: No   . Number of Times Moved in the Last Year: 0   . Homeless in the Last Year: No      Current Outpatient Medications:  .  albuterol MDI, PROVENTIL, VENTOLIN, PROAIR, HFA 90 mcg/actuation inhaler, INHALE 1 PUFF INTO THE LUNGS EVERY 4 HOURS AS NEEDED FOR WHEEZING OR SHORTNESS OF BREATH, Disp: 1 each, Rfl: 1 .  amLODIPine  (NORVASC ) 5 MG tablet, TAKE 1 TABLET EVERY DAY, Disp: 90 tablet, Rfl: 3 .  atorvastatin  (LIPITOR) 20 MG tablet, TAKE 1 TABLET ONE TIME DAILY, Disp: 90 tablet, Rfl: 3 .  busPIRone (BUSPAR) 15 MG tablet, TAKE 1 TABLET TWICE DAILY, Disp: 180 tablet, Rfl: 3 .  fluticasone  furoate-vilanteroL 200-25 mcg/dose DsDv, Inhale 1 Puff into the lungs once daily, Disp: , Rfl:  .  fluticasone propion-salmeteroL (ADVAIR DISKUS) 100-50 mcg/dose diskus inhaler, Inhale 1 Puff into the lungs every 12 (twelve) hours, Disp: 3 each, Rfl: 3 .  FUROsemide (LASIX) 40 MG tablet, Take 1 tablet (40 mg total) by mouth once daily as needed for Edema, Disp: 90 tablet, Rfl: 3 .  losartan  (COZAAR ) 100 MG tablet, TAKE 1 TABLET EVERY DAY, Disp: 90 tablet, Rfl: 3 .  naltrexone (REVIA) 50 mg tablet, Take 1 tablet (50 mg total) by mouth once daily, Disp: 90 tablet, Rfl: 3 .  potassium chloride (KLOR-CON) 10 MEQ ER tablet, TAKE 1 TABLET ONCE DAILY AS NEEDED, Disp: 90 tablet, Rfl: 3 .  sertraline  (ZOLOFT ) 100 MG tablet, TAKE 1 TABLET EVERY DAY, Disp: 90 tablet, Rfl: 3 .  tamsulosin (FLOMAX) 0.4 mg capsule, Take 0.4 mg by mouth daily after dinner, Disp: , Rfl:   Vitals:   05/14/24 0815  BP: 131/78  Pulse: 78   Body mass index is 26.63 kg/m. No acute distress Lungs; clear to ascultation Heart; Regular rate and rhythm  Abdomen; Soft and flat, normal bowel sounds Extremities; No clubbing, cyanosis or edema  Appointment on 02/14/2024  Component Date Value Ref Range Status  . Glucose 02/14/2024 114 (H)  70 - 110 mg/dL Final  . Sodium 91/91/7974 142  136 - 145 mmol/L Final  . Potassium 02/14/2024 4.5  3.6 - 5.1 mmol/L Final  . Chloride 02/14/2024 104  97 - 109 mmol/L Final  . Carbon Dioxide (CO2) 02/14/2024 25.6  22.0 - 32.0 mmol/L Final  . Urea Nitrogen (BUN) 02/14/2024 22  7 - 25 mg/dL Final  . Creatinine 91/91/7974 1.3  0.7 - 1.3 mg/dL Final  . Glomerular Filtration Rate (eGFR) 02/14/2024 57 (L)  >60 mL/min/1.73sq  m Final  . Calcium  02/14/2024 9.4  8.7 - 10.3 mg/dL Final  . AST  91/91/7974 24  8 - 39 U/L Final  . ALT  02/14/2024 22  6 - 57 U/L Final  . Alk Phos (alkaline Phosphatase) 02/14/2024 72  34 - 104 U/L Final  . Albumin 02/14/2024 4.6  3.5 - 4.8 g/dL Final  . Bilirubin, Total  02/14/2024 0.8  0.3 - 1.2 mg/dL Final  . Protein, Total 02/14/2024 6.8  6.1 - 7.9 g/dL Final  . A/G Ratio 91/91/7974 2.1  1.0 - 5.0 gm/dL Final  . WBC (White Blood Cell Count) 02/14/2024 8.8  4.1 - 10.2 10^3/uL Final  . RBC (Red Blood Cell Count) 02/14/2024 3.71 (L)  4.69 - 6.13 10^6/uL Final  . Hemoglobin 02/14/2024 13.2 (L)  14.1 - 18.1 gm/dL Final  . Hematocrit 91/91/7974 37.4 (L)  40.0 - 52.0 % Final  . MCV (Mean Corpuscular Volume) 02/14/2024 100.8 (H)  80.0 - 100.0 fl Final  . MCH (Mean Corpuscular Hemoglobin) 02/14/2024 35.6 (H)  27.0 - 31.2 pg Final  . MCHC (Mean Corpuscular Hemoglobin * 02/14/2024 35.3  32.0 - 36.0 gm/dL Final  . Platelet Count 02/14/2024 183  150 - 450 10^3/uL Final  . RDW-CV (Red Cell Distribution Widt* 02/14/2024 12.4  11.6 - 14.8 % Final  . MPV (Mean Platelet Volume) 02/14/2024 9.3 (L)  9.4 - 12.4 fl Final  . Neutrophils 02/14/2024 7.22  1.50 - 7.80 10^3/uL Final  . Lymphocytes 02/14/2024 0.93 (L)  1.00 - 3.60 10^3/uL Final  . Monocytes 02/14/2024 0.52  0.00 - 1.50 10^3/uL Final  . Eosinophils 02/14/2024 0.09  0.00 - 0.55 10^3/uL Final  . Basophils 02/14/2024 0.04  0.00 - 0.09 10^3/uL Final  . Neutrophil % 02/14/2024 81.6 (H)  32.0 - 70.0 % Final  . Lymphocyte % 02/14/2024 10.5  10.0 - 50.0 % Final  . Monocyte % 02/14/2024 5.9  4.0 - 13.0 % Final  . Eosinophil % 02/14/2024 1.0  1.0 - 5.0 % Final  . Basophil% 02/14/2024 0.5  0.0 - 2.0 % Final  . Immature Granulocyte % 02/14/2024 0.5  <=0.7 % Final  . Immature Granulocyte Count 02/14/2024 0.04  <=0.06 10^3/L Final  . Hemoglobin A1C 02/14/2024 5.9 (H)  4.2 - 5.6 % Final  . Average Blood Glucose (Calc) 02/14/2024 123  mg/dL Final  . Cholesterol, Total 02/14/2024 147  100 - 200 mg/dL Final  . Triglyceride 91/91/7974 107  35 - 199 mg/dL Final  . HDL (High Density Lipoprotein) Cho* 02/14/2024 57.2  29.0 - 71.0 mg/dL Final  . LDL Calculated 02/14/2024 68  0 - 130 mg/dL Final  . VLDL Cholesterol 02/14/2024 21   mg/dL Final  . Cholesterol/HDL Ratio 02/14/2024 2.6   Final    ASSESSMENT  AND PLAN:  Diagnoses and all orders for this visit:  COPD (chronic obstructive pulmonary disease) with chronic bronchitis (CMS/HHS-HCC) Assessment & Plan: Recent exacerbation noted. Improved, changed inhalers given affordability.    Alcoholism (CMS/HHS-HCC) Assessment & Plan: Stopped drinking several weeks ago, congratulated.    Encounter for prostate cancer screening -     PSA, Total (Screen)  Need for influenza vaccination -     FLU VACCINE MDCK IIV3 (EGG FREE), IM PF, (28MO+)(FLUCELVAX)    ? Daily cialis post psa check

## 2024-05-25 DIAGNOSIS — R972 Elevated prostate specific antigen [PSA]: Secondary | ICD-10-CM | POA: Insufficient documentation

## 2024-05-25 NOTE — Progress Notes (Signed)
 06/03/24 8:25 AM   Jack Henson 10/19/1950 [id number]   HPI: 73 y.o. male here for initial evaluation of elevated PSA  Component Ref Range & Units 11 d ago  PSA (Prostate Specific Antigen), Total 0.10 - 4.00 ng/mL 4.04 High     He has never seen a urologist Denies prior history of GU conditions, GU surgeries No family history of prostate cancer He does mention subacute on chronic worsening of LUTS, UUI, nocturia History of significant alcohol abuse, now abstinence x 1 month He drinks 6-8 cups of coffee daily (which he started recently and lieu of alcohol)  Recent hospitalization for COPD exacerbation (Nov 2025)   Hx of COPD, HTN, PVD, CAD s/p stent, alcoholic hepatitis w/ ascites   PMH: Past Medical History:  Diagnosis Date   Arthritis    COPD (chronic obstructive pulmonary disease) (HCC)    Diabetes mellitus without complication (HCC)    Hyperlipidemia    Hypertension     Surgical History: Past Surgical History:  Procedure Laterality Date   ANKLE SURGERY Right    CORONARY STENT INTERVENTION N/A 08/26/2017   Procedure: CORONARY STENT INTERVENTION;  Surgeon: Florencio Cara BIRCH, MD;  Location: [facility];  Service: Cardiovascular;  Laterality: N/A;   LEFT HEART CATH AND CORONARY ANGIOGRAPHY Left 08/26/2017   Procedure: LEFT HEART CATH AND CORONARY ANGIOGRAPHY;  Surgeon: Hester Wolm PARAS, MD;  Location: [facility];  Service: Cardiovascular;  Laterality: Left;   LEG SURGERY     stent placement   SHOULDER SURGERY Right     Family History: Family History  Problem Relation Age of Onset   Kidney disease Mother    Diabetes Father     Social History:  reports that he quit smoking about 10 years ago. His smoking use included cigarettes. He started smoking about 55 years ago. He has a 45 pack-year smoking history. He has never used smokeless tobacco. He reports current alcohol use. He reports current drug use. Drug: Marijuana.      Physical  Exam: BP 121/76   Pulse 97   Ht 5' 6 (1.676 m)   Wt 150 lb (68 kg)   BMI 24.21 kg/m    Constitutional:  Alert and oriented, No acute distress. Cardiovascular: No clubbing, cyanosis, or edema. Respiratory: Normal respiratory effort, no increased work of breathing. GI: Nondistended GU: DRE - ~30g gland, firm left apical gland, small nodule Skin: No rashes, bruises or suspicious lesions. Neurologic: Grossly intact, no focal deficits, moving all 4 extremities. Psychiatric: Normal mood and affect.  Laboratory Data:  Latest Reference Range & Units 05/10/24 10:03  Creatinine 0.61 - 1.24 mg/dL 8.61 (H)  (H): Data is abnormally high  Component Ref Range & Units 11 d ago  PSA (Prostate Specific Antigen), Total 0.10 - 4.00 ng/mL 4.04 High     omponent Ref Range & Units 1 yr ago  PSA (Prostate Specific Antigen), Total 0.10 - 4.00 ng/mL 4.51 High    Component Ref Range & Units 2 yr ago  PSA (Prostate Specific Antigen), Total 0.10 - 4.00 ng/mL 3.24   Component Ref Range & Units 3 yr ago  PSA (Prostate Specific Antigen), Total 0.10 - 4.00 ng/mL 2.71   Pertinent Imaging: I have personally viewed and interpreted the CT A/P (Oct 2025) - ~30g prostate. Senescent bilateral kidneys, otherwise morphologically normal. Ureters and bladder unremarkable.    Assessment & Plan:    Elevated PSA Assessment & Plan: PSA 4.0 (Nov 2025)   - from  3-4.5 baseline  - ~30g gland, PSAD 0.13   DRE- 30-40g gland, firm left apical gland, small nodule  We discussed the significance of an elevated prostate-specific antigen (PSA) level. PSA is a nonspecific marker and may be elevated due to both benign and malignant causes. Benign factors include benign prostatic hyperplasia (BPH), prostatitis or urinary tract infection, recent ejaculation, catheterization or instrumentation, and advancing age. Malignant causes include prostate cancer of varying risk categories.  We reviewed that a single PSA value is  less informative than following PSA trends over time, which can better reflect underlying pathology. Risk factors for prostate cancer include increasing age, family history of prostate cancer, African American race, and known germline mutations (e.g., BRCA2).  Next steps may include repeating PSA to confirm elevation, consideration of additional biomarkers or PSA derivatives (e.g., %free PSA, PSA density), and obtaining a multiparametric prostate MRI to assess for suspicious lesions. Based on PSA kinetics, risk profile, and MRI findings, a prostate biopsy may be recommended for definitive diagnosis.  - Concerning DRE, in the context of a borderline PSA for age.  Recommend MRI prostate to further characterize.  If positive MRI-plan to proceed with targeted biopsy.  If entirely negative MRI-shared decision making with continued PSA surveillance versus 12 core systematic biopsy   Orders: -     MR PROSTATE W WO CONTRAST; Future  Lower urinary tract symptoms (LUTS) Assessment & Plan: Subacute worsening his LUTS Irritative predominant, UUI with nocturia Significant daily caffeine intake, 6-8 coffees   Today we reviewed the physiology and common causes of male lower urinary tract symptoms (LUTS). Discussed potential etiologies including infectious, inflammatory, bladder-related, benign prostatic hyperplasia (BPH), and musculoskeletal/pelvic floor contributions. Reviewed the standard diagnostic workup (urinalysis, PVR, uroflow, prostate assessment, possible cystoscopy or imaging) and the spectrum of initial management strategies ranging from behavioral and lifestyle measures to pharmacologic therapy, with procedural options if indicated. All questions were addressed and the patient expressed understanding of the evaluation and treatment pathway.  - Emphasized attention to caffeine intake.  This is likely a large contributor to new/worsening LUTS (although understandably he has transition to coffee and lieu  of alcohol intake)  - Continue Flomax , which he started a couple of weeks ago - We may continue to monitor symptoms, particularly following elevated PSA workup       Penne Skye, MD 06/03/2024  [facility] [address] [place], [place] [address] [phone]

## 2024-05-25 NOTE — Assessment & Plan Note (Addendum)
 PSA 4.0 (Nov 2025)   - from 3-4.5 baseline  - ~30g gland, PSAD 0.13   DRE- 30-40g gland, firm left apical gland, small nodule  We discussed the significance of an elevated prostate-specific antigen (PSA) level. PSA is a nonspecific marker and may be elevated due to both benign and malignant causes. Benign factors include benign prostatic hyperplasia (BPH), prostatitis or urinary tract infection, recent ejaculation, catheterization or instrumentation, and advancing age. Malignant causes include prostate cancer of varying risk categories.  We reviewed that a single PSA value is less informative than following PSA trends over time, which can better reflect underlying pathology. Risk factors for prostate cancer include increasing age, family history of prostate cancer, African American race, and known germline mutations (e.g., BRCA2).  Next steps may include repeating PSA to confirm elevation, consideration of additional biomarkers or PSA derivatives (e.g., %free PSA, PSA density), and obtaining a multiparametric prostate MRI to assess for suspicious lesions. Based on PSA kinetics, risk profile, and MRI findings, a prostate biopsy may be recommended for definitive diagnosis.  - Concerning DRE, in the context of a borderline PSA for age.  Recommend MRI prostate to further characterize.  If positive MRI-plan to proceed with targeted biopsy.  If entirely negative MRI-shared decision making with continued PSA surveillance versus 12 core systematic biopsy

## 2024-06-03 ENCOUNTER — Other Ambulatory Visit: Payer: Self-pay

## 2024-06-03 ENCOUNTER — Ambulatory Visit: Admitting: Urology

## 2024-06-03 VITALS — BP 121/76 | HR 97 | Ht 66.0 in | Wt 150.0 lb

## 2024-06-03 DIAGNOSIS — R972 Elevated prostate specific antigen [PSA]: Secondary | ICD-10-CM

## 2024-06-03 DIAGNOSIS — R399 Unspecified symptoms and signs involving the genitourinary system: Secondary | ICD-10-CM | POA: Diagnosis not present

## 2024-06-03 NOTE — Assessment & Plan Note (Signed)
 Subacute worsening his LUTS Irritative predominant, UUI with nocturia Significant daily caffeine intake, 6-8 coffees   Today we reviewed the physiology and common causes of male lower urinary tract symptoms (LUTS). Discussed potential etiologies including infectious, inflammatory, bladder-related, benign prostatic hyperplasia (BPH), and musculoskeletal/pelvic floor contributions. Reviewed the standard diagnostic workup (urinalysis, PVR, uroflow, prostate assessment, possible cystoscopy or imaging) and the spectrum of initial management strategies ranging from behavioral and lifestyle measures to pharmacologic therapy, with procedural options if indicated. All questions were addressed and the patient expressed understanding of the evaluation and treatment pathway.  - Emphasized attention to caffeine intake.  This is likely a large contributor to new/worsening LUTS (although understandably he has transition to coffee and lieu of alcohol intake)  - Continue Flomax , which he started a couple of weeks ago - We may continue to monitor symptoms, particularly following elevated PSA workup

## 2024-06-03 NOTE — Patient Instructions (Signed)
 Please contact Central Scheduling to set up your prostate MRI at [phone].  Prostate MRI Prep:  1- No ejaculation 48 hours prior to exam  2- No caffeine or carbonated beverages on day of the exam  3- Eat light diet evening prior and day of exam  4- Avoid eating 4 hours prior to exam  5- Fleets enema needs to be done 4 hours prior to exam -See below. Can be purchased at the drug store.

## 2024-06-08 ENCOUNTER — Other Ambulatory Visit: Payer: Self-pay

## 2024-06-08 MED ORDER — TAMSULOSIN HCL 0.4 MG PO CAPS: 0.4000 mg | ORAL_CAPSULE | Freq: Every day | ORAL | 0 refills | Status: DC

## 2024-06-10 ENCOUNTER — Other Ambulatory Visit: Payer: Self-pay

## 2024-06-10 DIAGNOSIS — Z122 Encounter for screening for malignant neoplasm of respiratory organs: Secondary | ICD-10-CM

## 2024-06-10 DIAGNOSIS — Z87891 Personal history of nicotine dependence: Secondary | ICD-10-CM

## 2024-06-26 ENCOUNTER — Ambulatory Visit
Admission: RE | Admit: 2024-06-26 | Discharge: 2024-06-26 | Disposition: A | Discharge status: Home / Self Care | Source: Ambulatory Visit | Attending: Urology | Admitting: Urology

## 2024-06-26 DIAGNOSIS — R972 Elevated prostate specific antigen [PSA]: Secondary | ICD-10-CM | POA: Insufficient documentation

## 2024-06-26 MED ORDER — GADOBUTROL 1 MMOL/ML IV SOLN
6.0000 mL | Freq: Once | INTRAVENOUS | Status: AC | PRN
  Administered 2024-06-26: 6 mL via INTRAVENOUS

## 2024-06-30 ENCOUNTER — Other Ambulatory Visit: Payer: Self-pay | Admitting: Urology

## 2024-07-11 ENCOUNTER — Other Ambulatory Visit: Payer: Self-pay

## 2024-07-13 ENCOUNTER — Other Ambulatory Visit: Payer: Self-pay

## 2024-07-13 DIAGNOSIS — R399 Unspecified symptoms and signs involving the genitourinary system: Secondary | ICD-10-CM

## 2024-07-13 MED ORDER — TAMSULOSIN HCL 0.4 MG PO CAPS: 0.4000 mg | ORAL_CAPSULE | Freq: Every day | ORAL | 3 refills | Status: AC

## 2024-07-13 MED ORDER — TAMSULOSIN HCL 0.4 MG PO CAPS: 0.4000 mg | ORAL_CAPSULE | Freq: Every day | ORAL | 11 refills | Status: DC

## 2024-07-13 NOTE — Addendum Note (Signed)
 Addended by: Markale Birdsell E on: 07/13/2024 02:37 PM   Modules accepted: Orders

## 2024-08-27 ENCOUNTER — Other Ambulatory Visit: Admitting: Urology

## 2024-09-09 ENCOUNTER — Ambulatory Visit: Admitting: Urology

## 2024-11-13 ENCOUNTER — Ambulatory Visit (INDEPENDENT_AMBULATORY_CARE_PROVIDER_SITE_OTHER): Admitting: Nurse Practitioner

## 2024-11-13 ENCOUNTER — Encounter (INDEPENDENT_AMBULATORY_CARE_PROVIDER_SITE_OTHER)
